# Patient Record
Sex: Male | Born: 1950 | Race: White | Hispanic: No | Marital: Single | Smoking: Current every day smoker
Health system: Southern US, Community
[De-identification: ages and names within clinical notes are randomized; demographics above are authoritative.]

## PROBLEM LIST (undated history)

## (undated) DIAGNOSIS — M109 Gout, unspecified: Secondary | ICD-10-CM

## (undated) DIAGNOSIS — I10 Essential (primary) hypertension: Secondary | ICD-10-CM

---

## 2005-09-22 ENCOUNTER — Encounter: Admission: RE | Admit: 2005-09-22 | Discharge: 2005-09-22 | Payer: Self-pay | Admitting: Orthopedic Surgery

## 2005-10-12 ENCOUNTER — Encounter: Admission: RE | Admit: 2005-10-12 | Discharge: 2005-10-12 | Payer: Self-pay | Admitting: Orthopedic Surgery

## 2010-11-26 ENCOUNTER — Encounter: Payer: Self-pay | Admitting: Orthopedic Surgery

## 2012-01-06 ENCOUNTER — Encounter (HOSPITAL_BASED_OUTPATIENT_CLINIC_OR_DEPARTMENT_OTHER): Payer: Self-pay | Admitting: *Deleted

## 2012-01-06 ENCOUNTER — Emergency Department (INDEPENDENT_AMBULATORY_CARE_PROVIDER_SITE_OTHER): Payer: BC Managed Care – PPO

## 2012-01-06 ENCOUNTER — Emergency Department (HOSPITAL_BASED_OUTPATIENT_CLINIC_OR_DEPARTMENT_OTHER)
Admission: EM | Admit: 2012-01-06 | Discharge: 2012-01-06 | Disposition: A | Discharge status: Home / Self Care | Payer: BC Managed Care – PPO | Attending: Emergency Medicine | Admitting: Emergency Medicine

## 2012-01-06 DIAGNOSIS — J069 Acute upper respiratory infection, unspecified: Secondary | ICD-10-CM | POA: Insufficient documentation

## 2012-01-06 DIAGNOSIS — F411 Generalized anxiety disorder: Secondary | ICD-10-CM | POA: Insufficient documentation

## 2012-01-06 DIAGNOSIS — R05 Cough: Secondary | ICD-10-CM

## 2012-01-06 DIAGNOSIS — I10 Essential (primary) hypertension: Secondary | ICD-10-CM | POA: Insufficient documentation

## 2012-01-06 DIAGNOSIS — F419 Anxiety disorder, unspecified: Secondary | ICD-10-CM

## 2012-01-06 DIAGNOSIS — F17200 Nicotine dependence, unspecified, uncomplicated: Secondary | ICD-10-CM | POA: Insufficient documentation

## 2012-01-06 DIAGNOSIS — Z79899 Other long term (current) drug therapy: Secondary | ICD-10-CM | POA: Insufficient documentation

## 2012-01-06 HISTORY — DX: Essential (primary) hypertension: I10

## 2012-01-06 MED ORDER — LORATADINE-PSEUDOEPHEDRINE ER 10-240 MG PO TB24: 1.0000 | ORAL_TABLET | Freq: Every day | ORAL | Status: AC

## 2012-01-06 MED ORDER — DEXAMETHASONE 4 MG PO TABS
8.0000 mg | ORAL_TABLET | Freq: Once | ORAL | Status: AC
  Administered 2012-01-06: 8 mg via ORAL
  Filled 2012-01-06: qty 2

## 2012-01-06 MED ORDER — LORAZEPAM 1 MG PO TABS: 1.0000 mg | ORAL_TABLET | Freq: Three times a day (TID) | ORAL | Status: AC | PRN

## 2012-01-06 NOTE — ED Notes (Signed)
Pt states he had a choking episode 8 days ago and has since had increased cough, congestion, SHOB, difficulty swallowing and feeling anxious

## 2012-01-06 NOTE — Discharge Instructions (Signed)

## 2012-01-06 NOTE — ED Provider Notes (Signed)
History   This chart was scribed for Frederick Bailiff, MD by Charolett Bumpers . The patient was seen in room MH03/MH03 and the patient's care was started at 7:20pm.   CSN: [id number]  Arrival date & time 01/06/12  1844   First MD Initiated Contact with Patient 01/06/12 1918      Chief Complaint  Patient presents with  . Cough    (Consider location/radiation/quality/duration/timing/severity/associated sxs/prior treatment) HPI Frederick Chen is a 61 y.o. male who presents to the Emergency Department complaining of intermittent, moderate cough that started about 8 days ago after a choking episode. Patient reports associated congestion, SOB, difficulty swallowing, and feeling anxious. Patient denies chest pain. Patient states that his cough has been productive with phlegm for the past 8 days. Patient notes that he has taken Mucinex and Claritin for his symptoms, but with no relief. Patient only notes hypertension in past medical hx.      Past Medical History  Diagnosis Date  . Hypertension     History reviewed. No pertinent past surgical history.  No family history on file.  History  Substance Use Topics  . Smoking status: Current Everyday Smoker  . Smokeless tobacco: Not on file  . Alcohol Use: No      Review of Systems  Constitutional: Negative for fever.  HENT: Positive for congestion, rhinorrhea and trouble swallowing. Negative for sore throat.   Respiratory: Positive for cough and shortness of breath.   Gastrointestinal: Negative for nausea, vomiting and diarrhea.  Psychiatric/Behavioral: The patient is nervous/anxious.   All other systems reviewed and are negative.    Allergies  Crestor  Home Medications   Current Outpatient Rx  Name Route Sig Dispense Refill  . ALLOPURINOL 100 MG PO TABS Oral Take 100 mg by mouth daily.    . ATORVASTATIN CALCIUM 40 MG PO TABS Oral Take 40 mg by mouth daily.    . CHOLINE FENOFIBRATE 135 MG PO CPDR Oral Take 135 mg by  mouth daily.    . OMEGA-3 FATTY ACIDS 1000 MG PO CAPS Oral Take 2 g by mouth 2 (two) times daily.    . GUAIFENESIN ER 600 MG PO TB12 Oral Take 600 mg by mouth 2 (two) times daily.    Marland Kitchen LORATADINE 10 MG PO TABS Oral Take 10 mg by mouth daily.    Marland Kitchen METOPROLOL TARTRATE 100 MG PO TABS Oral Take 100 mg by mouth 2 (two) times daily.    . QUINAPRIL HCL 40 MG PO TABS Oral Take 40 mg by mouth daily.    Marland Kitchen LORATADINE-PSEUDOEPHEDRINE ER 10-240 MG PO TB24 Oral Take 1 tablet by mouth daily. 5 tablet 0  . LORAZEPAM 1 MG PO TABS Oral Take 1 tablet (1 mg total) by mouth 3 (three) times daily as needed for anxiety. 15 tablet 0    BP 173/109  Pulse 62  Temp(Src) 98.2 F (36.8 C) (Oral)  Resp 20  Ht 6\' 1"  (1.854 m)  Wt 195 lb (88.451 kg)  BMI 25.73 kg/m2  SpO2 100%  Physical Exam  Nursing note and vitals reviewed. Constitutional: He is oriented to person, place, and time. He appears well-developed and well-nourished. No distress.  HENT:  Head: Normocephalic and atraumatic.  Mouth/Throat: Oropharynx is clear and moist. No oropharyngeal exudate.  Eyes: EOM are normal. Pupils are equal, round, and reactive to light.  Neck: Normal range of motion. Neck supple. No tracheal deviation present.  Cardiovascular: Normal rate, regular rhythm and normal heart sounds.  Exam reveals  no gallop and no friction rub.   No murmur heard. Pulmonary/Chest: Effort normal and breath sounds normal. No respiratory distress. He has no wheezes. He has no rales.  Abdominal: Soft. Bowel sounds are normal. He exhibits no distension. There is no tenderness.  Musculoskeletal: Normal range of motion. He exhibits no edema.  Neurological: He is alert and oriented to person, place, and time. No sensory deficit.  Skin: Skin is warm and dry.  Psychiatric: He has a normal mood and affect. His behavior is normal.    ED Course  Procedures (including critical care time)  DIAGNOSTIC STUDIES: Oxygen Saturation is 100% on room air, normal  by my interpretation.    COORDINATION OF CARE:  1925: Discussed with patient the planned course of treatment.  1930: Medication Orders: Dexamethasone tablet 8 mg-once.   Labs Reviewed - No data to display Dg Chest 2 View  01/06/2012  *RADIOLOGY REPORT*  Clinical Data: Cough  CHEST - 2 VIEW  Comparison: None  Findings: The heart size and mediastinal contours are within normal limits.  Both lungs are clear.  The visualized skeletal structures are unremarkable.  IMPRESSION: Negative exam.  Original Report Authenticated By: Rosealee Albee, M.D.     1. Anxiety   2. URI (upper respiratory infection)       MDM  Chest x-ray is negative. I feel the majority of his symptoms are anxiety related. He received a dose of Decadron for any associated pharyngeal swelling. We'll place on Ativan and a decongestant for his congestion. There is no evidence of sinusitis on examination. He will be discharged home with instructions to followup with primary care physician this week   I personally performed the services described in this documentation, which was scribed in my presence. The recorded information has been reviewed and considered.       Frederick Bailiff, MD 01/06/12 2026

## 2017-09-30 ENCOUNTER — Observation Stay (HOSPITAL_COMMUNITY): Payer: Medicare Other | Admitting: Registered Nurse

## 2017-09-30 ENCOUNTER — Inpatient Hospital Stay: Admit: 2017-09-30 | Payer: Medicare Other | Admitting: Surgery

## 2017-09-30 ENCOUNTER — Encounter (HOSPITAL_BASED_OUTPATIENT_CLINIC_OR_DEPARTMENT_OTHER): Payer: Self-pay | Admitting: Emergency Medicine

## 2017-09-30 ENCOUNTER — Encounter (HOSPITAL_COMMUNITY): Admission: EM | Disposition: A | Discharge status: Home / Self Care | Payer: Self-pay | Source: Home / Self Care

## 2017-09-30 ENCOUNTER — Other Ambulatory Visit: Payer: Self-pay

## 2017-09-30 ENCOUNTER — Emergency Department (HOSPITAL_BASED_OUTPATIENT_CLINIC_OR_DEPARTMENT_OTHER): Payer: Medicare Other

## 2017-09-30 ENCOUNTER — Inpatient Hospital Stay (HOSPITAL_BASED_OUTPATIENT_CLINIC_OR_DEPARTMENT_OTHER)
Admission: EM | Admit: 2017-09-30 | Discharge: 2017-10-03 | DRG: 340 | Disposition: A | Discharge status: Home / Self Care | Payer: Medicare Other | Attending: Psychiatry | Admitting: Psychiatry

## 2017-09-30 DIAGNOSIS — E669 Obesity, unspecified: Secondary | ICD-10-CM | POA: Diagnosis present

## 2017-09-30 DIAGNOSIS — R630 Anorexia: Secondary | ICD-10-CM | POA: Diagnosis present

## 2017-09-30 DIAGNOSIS — Z79899 Other long term (current) drug therapy: Secondary | ICD-10-CM

## 2017-09-30 DIAGNOSIS — K3580 Unspecified acute appendicitis: Secondary | ICD-10-CM

## 2017-09-30 DIAGNOSIS — K3532 Acute appendicitis with perforation and localized peritonitis, without abscess: Secondary | ICD-10-CM | POA: Diagnosis not present

## 2017-09-30 DIAGNOSIS — F17210 Nicotine dependence, cigarettes, uncomplicated: Secondary | ICD-10-CM | POA: Diagnosis present

## 2017-09-30 DIAGNOSIS — Z7982 Long term (current) use of aspirin: Secondary | ICD-10-CM

## 2017-09-30 DIAGNOSIS — Z888 Allergy status to other drugs, medicaments and biological substances status: Secondary | ICD-10-CM

## 2017-09-30 DIAGNOSIS — K219 Gastro-esophageal reflux disease without esophagitis: Secondary | ICD-10-CM | POA: Diagnosis present

## 2017-09-30 DIAGNOSIS — I10 Essential (primary) hypertension: Secondary | ICD-10-CM | POA: Diagnosis present

## 2017-09-30 DIAGNOSIS — M109 Gout, unspecified: Secondary | ICD-10-CM | POA: Diagnosis present

## 2017-09-30 DIAGNOSIS — E785 Hyperlipidemia, unspecified: Secondary | ICD-10-CM | POA: Diagnosis present

## 2017-09-30 HISTORY — DX: Gout, unspecified: M10.9

## 2017-09-30 HISTORY — PX: LAPAROSCOPIC APPENDECTOMY: SHX408

## 2017-09-30 LAB — COMPREHENSIVE METABOLIC PANEL
ALT: 18 U/L (ref 17–63)
AST: 22 U/L (ref 15–41)
Albumin: 4 g/dL (ref 3.5–5.0)
Alkaline Phosphatase: 89 U/L (ref 38–126)
Anion gap: 8 (ref 5–15)
BUN: 14 mg/dL (ref 6–20)
CO2: 26 mmol/L (ref 22–32)
Calcium: 9.2 mg/dL (ref 8.9–10.3)
Chloride: 103 mmol/L (ref 101–111)
Creatinine, Ser: 0.84 mg/dL (ref 0.61–1.24)
GFR calc Af Amer: >60 mL/min (ref >60)
GFR calc non Af Amer: >60 mL/min (ref >60)
Glucose, Bld: 110 mg/dL — ABNORMAL HIGH (ref 65–99)
Potassium: 4.2 mmol/L (ref 3.5–5.1)
Sodium: 137 mmol/L (ref 135–145)
Total Bilirubin: 0.9 mg/dL (ref 0.3–1.2)
Total Protein: 7.6 g/dL (ref 6.5–8.1)

## 2017-09-30 LAB — CBC
HCT: 45.5 % (ref 39.0–52.0)
Hemoglobin: 15.7 g/dL (ref 13.0–17.0)
MCH: 33.4 pg (ref 26.0–34.0)
MCHC: 34.5 g/dL (ref 30.0–36.0)
MCV: 96.8 fL (ref 78.0–100.0)
Platelets: 142 10*3/uL — ABNORMAL LOW (ref 150–400)
RBC: 4.7 MIL/uL (ref 4.22–5.81)
RDW: 14 % (ref 11.5–15.5)
WBC: 13.2 10*3/uL — ABNORMAL HIGH (ref 4.0–10.5)

## 2017-09-30 LAB — LIPASE, BLOOD: Lipase: 36 U/L (ref 11–51)

## 2017-09-30 SURGERY — APPENDECTOMY, LAPAROSCOPIC
Anesthesia: General | Site: Abdomen

## 2017-09-30 MED ORDER — RINGERS IRRIGATION IR SOLN
Status: DC | PRN
  Administered 2017-09-30: 1000 mL

## 2017-09-30 MED ORDER — PROMETHAZINE HCL 25 MG/ML IJ SOLN: 6.2500 mg | INTRAMUSCULAR | Status: DC | PRN

## 2017-09-30 MED ORDER — PIPERACILLIN-TAZOBACTAM 3.375 G IVPB
3.3750 g | Freq: Three times a day (TID) | INTRAVENOUS | Status: DC
  Filled 2017-09-30: qty 50

## 2017-09-30 MED ORDER — DEXAMETHASONE SODIUM PHOSPHATE 10 MG/ML IJ SOLN
INTRAMUSCULAR | Status: AC
  Filled 2017-09-30: qty 1

## 2017-09-30 MED ORDER — ONDANSETRON HCL 4 MG/2ML IJ SOLN
INTRAMUSCULAR | Status: AC
  Filled 2017-09-30: qty 2

## 2017-09-30 MED ORDER — MIDAZOLAM HCL 2 MG/2ML IJ SOLN
INTRAMUSCULAR | Status: AC
  Filled 2017-09-30: qty 2

## 2017-09-30 MED ORDER — PHENYLEPHRINE HCL 10 MG/ML IJ SOLN
INTRAMUSCULAR | Status: DC | PRN
  Administered 2017-09-30: 80 ug via INTRAVENOUS
  Administered 2017-09-30: 120 ug via INTRAVENOUS

## 2017-09-30 MED ORDER — IOPAMIDOL (ISOVUE-300) INJECTION 61%
100.0000 mL | Freq: Once | INTRAVENOUS | Status: AC | PRN
  Administered 2017-09-30: 100 mL via INTRAVENOUS

## 2017-09-30 MED ORDER — ROCURONIUM BROMIDE 50 MG/5ML IV SOSY
PREFILLED_SYRINGE | INTRAVENOUS | Status: AC
  Filled 2017-09-30: qty 5

## 2017-09-30 MED ORDER — ROCURONIUM BROMIDE 100 MG/10ML IV SOLN
INTRAVENOUS | Status: DC | PRN
  Administered 2017-09-30: 50 mg via INTRAVENOUS
  Administered 2017-09-30: 10 mg via INTRAVENOUS

## 2017-09-30 MED ORDER — 0.9 % SODIUM CHLORIDE (POUR BTL) OPTIME
TOPICAL | Status: DC | PRN
  Administered 2017-09-30: 1000 mL

## 2017-09-30 MED ORDER — PHENYLEPHRINE 40 MCG/ML (10ML) SYRINGE FOR IV PUSH (FOR BLOOD PRESSURE SUPPORT)
PREFILLED_SYRINGE | INTRAVENOUS | Status: AC
  Filled 2017-09-30: qty 10

## 2017-09-30 MED ORDER — BUPIVACAINE-EPINEPHRINE (PF) 0.25% -1:200000 IJ SOLN
INTRAMUSCULAR | Status: AC
  Filled 2017-09-30: qty 30

## 2017-09-30 MED ORDER — ONDANSETRON HCL 4 MG/2ML IJ SOLN
4.0000 mg | Freq: Once | INTRAMUSCULAR | Status: AC
  Administered 2017-09-30: 4 mg via INTRAVENOUS
  Filled 2017-09-30: qty 2

## 2017-09-30 MED ORDER — LACTATED RINGERS IV SOLN
INTRAVENOUS | Status: DC | PRN
  Administered 2017-09-30 (×2): via INTRAVENOUS

## 2017-09-30 MED ORDER — SODIUM CHLORIDE 0.9 % IV BOLUS (SEPSIS)
500.0000 mL | Freq: Once | INTRAVENOUS | Status: AC
  Administered 2017-09-30: 500 mL via INTRAVENOUS

## 2017-09-30 MED ORDER — ALBUMIN HUMAN 5 % IV SOLN
INTRAVENOUS | Status: AC
  Filled 2017-09-30: qty 250

## 2017-09-30 MED ORDER — FENTANYL CITRATE (PF) 250 MCG/5ML IJ SOLN
INTRAMUSCULAR | Status: AC
  Filled 2017-09-30: qty 5

## 2017-09-30 MED ORDER — SUCCINYLCHOLINE CHLORIDE 200 MG/10ML IV SOSY
PREFILLED_SYRINGE | INTRAVENOUS | Status: AC
  Filled 2017-09-30: qty 10

## 2017-09-30 MED ORDER — KCL IN DEXTROSE-NACL 20-5-0.45 MEQ/L-%-% IV SOLN: INTRAVENOUS | Status: DC

## 2017-09-30 MED ORDER — LIDOCAINE 2% (20 MG/ML) 5 ML SYRINGE
INTRAMUSCULAR | Status: AC
  Filled 2017-09-30: qty 5

## 2017-09-30 MED ORDER — ALBUMIN HUMAN 5 % IV SOLN
INTRAVENOUS | Status: DC | PRN
  Administered 2017-09-30: 23:00:00 via INTRAVENOUS

## 2017-09-30 MED ORDER — DEXAMETHASONE SODIUM PHOSPHATE 10 MG/ML IJ SOLN
INTRAMUSCULAR | Status: DC | PRN
  Administered 2017-09-30: 10 mg via INTRAVENOUS

## 2017-09-30 MED ORDER — MORPHINE SULFATE (PF) 4 MG/ML IV SOLN
4.0000 mg | INTRAVENOUS | Status: DC | PRN
  Administered 2017-09-30: 4 mg via INTRAVENOUS
  Filled 2017-09-30: qty 1

## 2017-09-30 MED ORDER — ONDANSETRON HCL 4 MG/2ML IJ SOLN
INTRAMUSCULAR | Status: DC | PRN
  Administered 2017-09-30: 4 mg via INTRAVENOUS

## 2017-09-30 MED ORDER — SUGAMMADEX SODIUM 200 MG/2ML IV SOLN
INTRAVENOUS | Status: AC
  Filled 2017-09-30: qty 2

## 2017-09-30 MED ORDER — FENTANYL CITRATE (PF) 100 MCG/2ML IJ SOLN
INTRAMUSCULAR | Status: DC | PRN
  Administered 2017-09-30: 100 ug via INTRAVENOUS
  Administered 2017-09-30: 50 ug via INTRAVENOUS
  Administered 2017-09-30: 100 ug via INTRAVENOUS
  Administered 2017-09-30 – 2017-10-01 (×2): 50 ug via INTRAVENOUS

## 2017-09-30 MED ORDER — PIPERACILLIN-TAZOBACTAM 3.375 G IVPB 30 MIN
3.3750 g | Freq: Once | INTRAVENOUS | Status: AC
  Administered 2017-09-30: 3.375 g via INTRAVENOUS
  Filled 2017-09-30 (×2): qty 50

## 2017-09-30 MED ORDER — PROPOFOL 10 MG/ML IV BOLUS
INTRAVENOUS | Status: AC
  Filled 2017-09-30: qty 40

## 2017-09-30 MED ORDER — SUCCINYLCHOLINE CHLORIDE 20 MG/ML IJ SOLN
INTRAMUSCULAR | Status: DC | PRN
  Administered 2017-09-30: 140 mg via INTRAVENOUS

## 2017-09-30 MED ORDER — LIDOCAINE HCL (CARDIAC) 20 MG/ML IV SOLN
INTRAVENOUS | Status: DC | PRN
  Administered 2017-09-30: 75 mg via INTRAVENOUS
  Administered 2017-09-30: 25 mg via INTRATRACHEAL

## 2017-09-30 MED ORDER — HYDROMORPHONE HCL 1 MG/ML IJ SOLN: 0.2500 mg | INTRAMUSCULAR | Status: DC | PRN

## 2017-09-30 MED ORDER — MORPHINE SULFATE (PF) 4 MG/ML IV SOLN: 4.0000 mg | Freq: Once | INTRAVENOUS | Status: DC

## 2017-09-30 MED ORDER — MIDAZOLAM HCL 5 MG/5ML IJ SOLN
INTRAMUSCULAR | Status: DC | PRN
  Administered 2017-09-30: 2 mg via INTRAVENOUS

## 2017-09-30 MED ORDER — PIPERACILLIN-TAZOBACTAM 3.375 G IVPB 30 MIN: 3.3750 g | Freq: Once | INTRAVENOUS | Status: DC

## 2017-09-30 MED ORDER — MORPHINE SULFATE (PF) 4 MG/ML IV SOLN
4.0000 mg | Freq: Once | INTRAVENOUS | Status: AC
  Administered 2017-09-30: 4 mg via INTRAVENOUS
  Filled 2017-09-30: qty 1

## 2017-09-30 MED ORDER — BUPIVACAINE-EPINEPHRINE 0.25% -1:200000 IJ SOLN
INTRAMUSCULAR | Status: DC | PRN
  Administered 2017-09-30: 10 mL

## 2017-09-30 MED ORDER — PROPOFOL 10 MG/ML IV BOLUS
INTRAVENOUS | Status: DC | PRN
  Administered 2017-09-30: 200 mg via INTRAVENOUS

## 2017-09-30 SURGICAL SUPPLY — 46 items
APPLIER CLIP ROT 10 11.4 M/L (STAPLE)
APR CLP MED LRG 11.4X10 (STAPLE)
BAG SPEC RTRVL LRG 6X4 10 (ENDOMECHANICALS) ×1
BLADE CLIPPER SENSICLIP SURGIC (BLADE) ×2 IMPLANT
CHLORAPREP W/TINT 26ML (MISCELLANEOUS) ×3 IMPLANT
CLIP APPLIE ROT 10 11.4 M/L (STAPLE) IMPLANT
CLOSURE WOUND 1/2 X4 (GAUZE/BANDAGES/DRESSINGS) ×1
COVER SURGICAL LIGHT HANDLE (MISCELLANEOUS) ×3 IMPLANT
CUTTER FLEX LINEAR 45M (STAPLE) ×2 IMPLANT
DECANTER SPIKE VIAL GLASS SM (MISCELLANEOUS) ×3 IMPLANT
DRAIN CHANNEL 19F RND (DRAIN) ×2 IMPLANT
DRAPE LAPAROSCOPIC ABDOMINAL (DRAPES) ×1 IMPLANT
ELECT REM PT RETURN 15FT ADLT (MISCELLANEOUS) ×3 IMPLANT
ENDOLOOP SUT PDS II  0 18 (SUTURE)
ENDOLOOP SUT PDS II 0 18 (SUTURE) IMPLANT
EVACUATOR DRAINAGE 10X20 100CC (DRAIN) IMPLANT
EVACUATOR SILICONE 100CC (DRAIN) ×3
GAUZE SPONGE 2X2 8PLY STRL LF (GAUZE/BANDAGES/DRESSINGS) IMPLANT
GAUZE SPONGE 4X4 12PLY STRL (GAUZE/BANDAGES/DRESSINGS) IMPLANT
GLOVE BIOGEL PI IND STRL 7.0 (GLOVE) IMPLANT
GLOVE BIOGEL PI INDICATOR 7.0 (GLOVE) ×2
GLOVE SURG ORTHO 8.0 STRL STRW (GLOVE) ×3 IMPLANT
GOWN STRL REUS W/TWL XL LVL3 (GOWN DISPOSABLE) ×6 IMPLANT
IV LACTATED RINGERS 1000ML (IV SOLUTION) ×2 IMPLANT
KIT BASIN OR (CUSTOM PROCEDURE TRAY) ×3 IMPLANT
NS IRRIG 1000ML POUR BTL (IV SOLUTION) ×2 IMPLANT
POUCH SPECIMEN RETRIEVAL 10MM (ENDOMECHANICALS) ×3 IMPLANT
RELOAD 45 VASCULAR/THIN (ENDOMECHANICALS) IMPLANT
RELOAD STAPLE 45 2.5 WHT GRN (ENDOMECHANICALS) IMPLANT
RELOAD STAPLE 45 3.5 BLU ETS (ENDOMECHANICALS) IMPLANT
RELOAD STAPLE TA45 3.5 REG BLU (ENDOMECHANICALS) ×3 IMPLANT
SET IRRIG TUBING LAPAROSCOPIC (IRRIGATION / IRRIGATOR) ×3 IMPLANT
SHEARS HARMONIC ACE PLUS 36CM (ENDOMECHANICALS) ×3 IMPLANT
SPONGE DRAIN TRACH 4X4 STRL 2S (GAUZE/BANDAGES/DRESSINGS) ×2 IMPLANT
SPONGE GAUZE 2X2 STER 10/PKG (GAUZE/BANDAGES/DRESSINGS) ×2
STRIP CLOSURE SKIN 1/2X4 (GAUZE/BANDAGES/DRESSINGS) ×2 IMPLANT
SUT ETHILON 2 0 PS N (SUTURE) ×2 IMPLANT
SUT MNCRL AB 4-0 PS2 18 (SUTURE) ×3 IMPLANT
TAPE CLOTH SURG 4X10 WHT LF (GAUZE/BANDAGES/DRESSINGS) ×2 IMPLANT
TOWEL OR 17X26 10 PK STRL BLUE (TOWEL DISPOSABLE) ×3 IMPLANT
TOWEL OR NON WOVEN STRL DISP B (DISPOSABLE) ×3 IMPLANT
TRAY FOLEY W/METER SILVER 14FR (SET/KITS/TRAYS/PACK) IMPLANT
TRAY FOLEY W/METER SILVER 16FR (SET/KITS/TRAYS/PACK) ×2 IMPLANT
TRAY LAPAROSCOPIC (CUSTOM PROCEDURE TRAY) ×3 IMPLANT
TROCAR XCEL BLUNT TIP 100MML (ENDOMECHANICALS) ×3 IMPLANT
TROCAR XCEL NON-BLD 11X100MML (ENDOMECHANICALS) ×3 IMPLANT

## 2017-09-30 NOTE — ED Triage Notes (Signed)
patient states that he is having pain to his right lower side and abdominal  - patient states that he is having increased pain with palpation to his right lower quad

## 2017-09-30 NOTE — ED Notes (Signed)
Pt arrives from [facility] for a surgery consult.

## 2017-09-30 NOTE — Anesthesia Procedure Notes (Signed)
Procedure Name: Intubation Date/Time: 09/30/2017 10:42 PM Performed by: Lissa Morales, CRNA Pre-anesthesia Checklist: Patient identified, Emergency Drugs available, Suction available and Patient being monitored Patient Re-evaluated:Patient Re-evaluated prior to induction Oxygen Delivery Method: Circle system utilized Preoxygenation: Pre-oxygenation with 100% oxygen Induction Type: IV induction Ventilation: Mask ventilation without difficulty Laryngoscope Size: Mac and 4 Grade View: Grade II Tube type: Oral Tube size: 8.0 mm Number of attempts: 1 Airway Equipment and Method: Stylet and Oral airway Placement Confirmation: ETT inserted through vocal cords under direct vision,  positive ETCO2 and breath sounds checked- equal and bilateral Secured at: 22 cm Tube secured with: Tape Dental Injury: Teeth and Oropharynx as per pre-operative assessment

## 2017-09-30 NOTE — ED Notes (Signed)
Pt attempted to provide a urine, but was unable to at this time.

## 2017-09-30 NOTE — OR Nursing (Signed)
Patient valuables itemized with patient and sent to security safe. Items consist of: X6, 20 dollar bills, x1 5 dollar bills, x1 1 dollar bill, x1 [place] [address] Visa, x1 Cherylsidexxon Mobil Card, x1 Bancorp [place] Continental AirlinesMC, x1 Lowes Card, x1 [place] Radiation protection practitionerDriver License, x1 Intel Corporationmerican Express, Goldman Sachsx1 Voter Registration Card, x1 Neiman [place] Far[place] card, x1 Dillards Card, x1 W.W. Grainger IncMacy American Express card, Consecox1 American Express Business Gold card, Hexion Specialty Chemicalsx1 Black iPhone, x1 Genuine PartsLincoln Key fob, x3 discount MetLifemerchant cards (Lowes Foods, My KathrynPanera, Goodrich CorporationFood Lion, x4 Southern Companynsurance cards ([place]Aetna, Horse ShoeAetnaRX, Harrah's EntertainmentMedicare card x2), x1 Enterprise ProductsUnited Mileage Card, x1 Blue cross United Technologies CorporationBlue shield card, x2 Social Security cards, Golden West Financialx1 Library Card.  This RN has possession of safe key, will transfer possession of the key to floor RN upon admission of patient.

## 2017-09-30 NOTE — H&P (Signed)
Frederick Chen is an 66 y.o. male.    General Surgery [facility]  Chief Complaint: abd pain, acute appendicitis  HPI: patient is a 66 yo WM referred by Dr. Nanda Quinton at [place] for surgical management of acute appendicitis.  Patient presented with 2 day hx of abd pain localizing to the RLQ.  Denies nausea or emesis, but anorexia present.  Denies fever or chills.  No prior abdominal surgery.  WBC 13K.  CTA positive for acute appendicitis.  Patient transferred to [facility] ER for evaluation and preparation for surgery.  Past Medical History:  Diagnosis Date  . Gout   . Hypertension     History reviewed. No pertinent surgical history.  History reviewed. No pertinent family history. Social History:  reports that he has been smoking.  he has never used smokeless tobacco. He reports that he does not drink alcohol or use drugs.  Allergies:  Allergies  Allergen Reactions  . Crestor [Rosuvastatin Calcium]     Muscle weakness      (Not in a hospital admission)  Results for orders placed or performed during the hospital encounter of 09/30/17 (from the past 48 hour(s))  Lipase, blood     Status: None   Collection Time: 09/30/17  3:15 PM  Result Value Ref Range   Lipase 36 11 - 51 U/L  Comprehensive metabolic panel     Status: Abnormal   Collection Time: 09/30/17  3:15 PM  Result Value Ref Range   Sodium 137 135 - 145 mmol/L   Potassium 4.2 3.5 - 5.1 mmol/L   Chloride 103 101 - 111 mmol/L   CO2 26 22 - 32 mmol/L   Glucose, Bld 110 (H) 65 - 99 mg/dL   BUN 14 6 - 20 mg/dL   Creatinine, Ser 0.84 0.61 - 1.24 mg/dL   Calcium 9.2 8.9 - 10.3 mg/dL   Total Protein 7.6 6.5 - 8.1 g/dL   Albumin 4.0 3.5 - 5.0 g/dL   AST 22 15 - 41 U/L   ALT 18 17 - 63 U/L   Alkaline Phosphatase 89 38 - 126 U/L   Total Bilirubin 0.9 0.3 - 1.2 mg/dL   GFR calc non Af Amer >60 >60 mL/min   GFR calc Af Amer >60 >60 mL/min    Comment: (NOTE) The eGFR has been calculated using the CKD  EPI equation. This calculation has not been validated in all clinical situations. eGFR's persistently <60 mL/min signify possible Chronic Kidney Disease.    Anion gap 8 5 - 15  CBC     Status: Abnormal   Collection Time: 09/30/17  3:15 PM  Result Value Ref Range   WBC 13.2 (H) 4.0 - 10.5 K/uL   RBC 4.70 4.22 - 5.81 MIL/uL   Hemoglobin 15.7 13.0 - 17.0 g/dL   HCT 45.5 39.0 - 52.0 %   MCV 96.8 78.0 - 100.0 fL   MCH 33.4 26.0 - 34.0 pg   MCHC 34.5 30.0 - 36.0 g/dL   RDW 14.0 11.5 - 15.5 %   Platelets 142 (L) 150 - 400 K/uL   Ct Abdomen Pelvis W Contrast  Result Date: 09/30/2017 CLINICAL DATA:  Right lower quadrant pain times several days with tenderness to palpation, decreased appetite, fever and leukocytosis. EXAM: CT ABDOMEN AND PELVIS WITH CONTRAST TECHNIQUE: Multidetector CT imaging of the abdomen and pelvis was performed using the standard protocol following bolus administration of intravenous contrast. CONTRAST:  173m ISOVUE-300 IOPAMIDOL (ISOVUE-300) INJECTION 61% COMPARISON:  05/04/2016 FINDINGS: Lower chest: Heart size is normal with minimal coronary arteriosclerosis. Mild distal esophageal thickening appears chronic and may reflect chronic esophagitis. There is dependent atelectasis at the lung bases. Tiny subpleural nodular opacities are seen within the lung bases more so the right, nonspecific possibly postinfectious or post inflammatory in etiology, the largest are approximately 4 mm. Hepatobiliary: Homogeneous enhancement of the liver. There appear to be tiny dependent densities within the gallbladder that are suspicious for stones and/or biliary sludge. No biliary dilatation. Pancreas: No ductal dilatation or inflammation of the pancreas. No focal mass. Spleen: Normal size spleen without mass. Adrenals/Urinary Tract: Mild cortical thinning of both kidneys along the midportion. No obstructive uropathy or enhancing masses. No adrenal mass. Physiologic distention of the urinary  bladder. Mild sympathetic thickening along the anterior hand right lateral wall likely secondary to adjacent inflammatory disease from appendicitis. Stomach/Bowel: Extensive right lower quadrant mesenteric fatty inflammatory change with trace right lower quadrant and hemi pelvic free fluid is identified without enhancement. This is secondary to acute appendicitis. Appendix: Location: Right lower quadrant at McBurney's point Diameter: 13 mm Appendicolith: Present along its proximal to midportion measuring up to 17 mm in length by 9 mm in thickness. Mucosal hyper-enhancement: Present Extraluminal gas: None Periappendiceal collection: None There is sigmoid diverticulosis without acute diverticulitis. Vascular/Lymphatic: Moderate aortoiliac atherosclerosis. No lymphadenopathy by CT size criteria. Reproductive: Normal size prostate with central zone calcifications. Other: Omental fat and fluid containing right inguinal canal. Musculoskeletal: Lumbar spondylosis with marked degenerative disc disease and vacuum disc phenomenon. No acute nor suspicious osseous abnormality. IMPRESSION: 1. Moderate-to-marked periappendiceal mesenteric inflammation secondary to acute appendicitis. The appendix measures up to 13 mm and contains hyperdense material within consistent and appendicular as above described. No perforation or definite enhancing fluid to suggest abscess. 2. Mild renal cortical thinning.  No obstructive uropathy. 3. Chronic mild distal esophageal thickening question esophagitis. 4. Lumbar spondylosis without acute osseous abnormality. 5. Faint hyperdensities along the dependent aspect of the gallbladder are suggested. Findings are suspicious for small gallstones and biliary sludge. 6. Omental fat and fluid containing right inguinal hernia. Electronically Signed   By: Ashley Royalty M.D.   On: 09/30/2017 18:27    Review of Systems  Constitutional: Negative for chills, diaphoresis, fever and weight loss.  HENT:  Negative.   Eyes: Negative.   Respiratory: Negative.   Cardiovascular: Negative.   Gastrointestinal: Positive for abdominal pain (RLQ) and heartburn. Negative for constipation, diarrhea, nausea and vomiting.  Genitourinary: Negative.   Musculoskeletal: Negative.   Skin: Negative.   Neurological: Negative.  Negative for weakness.  Endo/Heme/Allergies: Negative.   Psychiatric/Behavioral: Negative.     Blood pressure 140/72, pulse 80, temperature 99.2 F (37.3 C), temperature source Oral, resp. rate 18, height _0  (1.854 m), SpO2 94 %. Physical Exam  Constitutional: He is oriented to person, place, and time. He appears well-developed and well-nourished. No distress.  HENT:  Head: Normocephalic and atraumatic.  Right Ear: External ear normal.  Left Ear: External ear normal.  Mouth/Throat: No oropharyngeal exudate.  Eyes: Conjunctivae are normal. Pupils are equal, round, and reactive to light. No scleral icterus.  Neck: Normal range of motion. Neck supple. No tracheal deviation present. No thyromegaly present.  Cardiovascular: Normal rate, regular rhythm, normal heart sounds and intact distal pulses.  No murmur heard. Respiratory: Effort normal and breath sounds normal. No respiratory distress. He has no wheezes.  GI: Soft. He exhibits no distension and no mass. There is tenderness (RLQ). There is rebound and  guarding.  Musculoskeletal: Normal range of motion. He exhibits no edema, tenderness or deformity.  Neurological: He is alert and oriented to person, place, and time.  Skin: Skin is warm and dry. He is not diaphoretic.  Psychiatric: He has a normal mood and affect. His behavior is normal.     Assessment/Plan Acute appendicitis  IV Zosyn given by ER MD  NPO  IVF   Plan lap appendectomy this evening.  Discussed with patient.  Discussed hospital stay.  He understands and wishes to proceed.  The risks and benefits of the procedure have been discussed at length with the  patient.  The patient understands the proposed procedure, potential alternative treatments, and the course of recovery to be expected.  All of the patient's questions have been answered at this time.  The patient wishes to proceed with surgery.  Armandina Gemma, [facility] Office: [phone]    Earnstine Regal, MD 09/30/2017, 9:24 PM

## 2017-09-30 NOTE — ED Notes (Signed)
Pt to OR.

## 2017-09-30 NOTE — Anesthesia Preprocedure Evaluation (Signed)
Anesthesia Evaluation  Patient identified by MRN, date of birth, ID band Patient awake    Reviewed: Allergy & Precautions, NPO status , Patient's Chart, lab work & pertinent test results, reviewed documented beta blocker date and time   History of Anesthesia Complications Negative for: history of anesthetic complications  Airway Mallampati: II  TM Distance: >3 FB Neck ROM: Full    Dental no notable dental hx. (+) Dental Advisory Given   Pulmonary Current Smoker,    Pulmonary exam normal        Cardiovascular hypertension, Pt. on medications and Pt. on home beta blockers Normal cardiovascular exam     Neuro/Psych negative neurological ROS  negative psych ROS   GI/Hepatic Neg liver ROS,   Endo/Other  negative endocrine ROS  Renal/GU negative Renal ROS  negative genitourinary   Musculoskeletal negative musculoskeletal ROS (+)   Abdominal   Peds negative pediatric ROS (+)  Hematology negative hematology ROS (+)   Anesthesia Other Findings   Reproductive/Obstetrics negative OB ROS                             Anesthesia Physical Anesthesia Plan  ASA: II and emergent  Anesthesia Plan: General   Post-op Pain Management:    Induction: Intravenous, Rapid sequence and Cricoid pressure planned  PONV Risk Score and Plan: 2 and Ondansetron and Dexamethasone  Airway Management Planned: Oral ETT  Additional Equipment:   Intra-op Plan:   Post-operative Plan: Extubation in OR  Informed Consent: I have reviewed the patients History and Physical, chart, labs and discussed the procedure including the risks, benefits and alternatives for the proposed anesthesia with the patient or authorized representative who has indicated his/her understanding and acceptance.   Dental advisory given  Plan Discussed with: CRNA, Anesthesiologist and Surgeon  Anesthesia Plan Comments:          Anesthesia Quick Evaluation

## 2017-09-30 NOTE — ED Notes (Signed)
[facility] arrived to transport pt 

## 2017-09-30 NOTE — ED Provider Notes (Signed)
MSE was initiated and I personally evaluated the patient and placed orders (if any) at  8:42 PM on September 30, 2017. Patient here for [facility]. [place] to have appendectomy.  Will medicate for pain.  Discussed with OR nurse who will let Dr. Gerrit Friendsgerkin know that the patient is here The patient appears stable so that the remainder of the MSE may be completed by another provider.   Lorre NickAllen, Starlene Consuegra, MD 09/30/17 2043

## 2017-09-30 NOTE — ED Provider Notes (Signed)
Emergency Department Provider Note   I have reviewed the triage vital signs and the nursing notes.   HISTORY  Chief Complaint Abdominal Pain   HPI Frederick Chen is a 66 y.o. male with HTN presents to the emergency department for evaluation of diffuse abdominal discomfort that has now localized to the right lower quadrant.  Patient has had 2 days of pain with right sided pain starting today.  No radiation of symptoms.  He had a small bowel movement today but no diarrhea.  No blood in the bowel movement.  No nausea or vomiting.  He has not felt like eating anything for the past 2 days.  He did eat a small egg today at lunch, however.  No fevers or chills.  No chest pain or difficulty breathing.   Past Medical History:  Diagnosis Date  . Gout   . Hypertension     There are no active problems to display for this patient.   History reviewed. No pertinent surgical history.  Current Outpatient Rx  . Order #: [id number] Class: Historical Med  . Order #: [id number] Class: Historical Med  . Order #: [id number] Class: Historical Med  . Order #: [id number] Class: Historical Med  . Order #: [id number] Class: Historical Med  . Order #: [id number] Class: Historical Med  . Order #: [id number] Class: Historical Med  . Order #: [id number] Class: Historical Med    Allergies Crestor [rosuvastatin calcium]  History reviewed. No pertinent family history.  Social History Social History   Tobacco Use  . Smoking status: Current Every Day Smoker  . Smokeless tobacco: Never Used  Substance Use Topics  . Alcohol use: No  . Drug use: No    Review of Systems  Constitutional: No fever/chills Eyes: No visual changes. ENT: No sore throat. Cardiovascular: Denies chest pain. Respiratory: Denies shortness of breath. Gastrointestinal: Positive RLQ abdominal pain.  No nausea, no vomiting.  No diarrhea.  No constipation. Genitourinary: Negative for dysuria. Musculoskeletal: Negative for back pain. Skin: Negative for  rash. Neurological: Negative for headaches, focal weakness or numbness.  10-point ROS otherwise negative.  ____________________________________________   PHYSICAL EXAM:  VITAL SIGNS: ED Triage Vitals  Enc Vitals Group     BP 09/30/17 1443 (!) 148/91     Pulse Rate 09/30/17 1443 83     Resp 09/30/17 1443 18     Temp 09/30/17 1443 98.1 F (36.7 C)     Temp Source 09/30/17 1443 Oral     SpO2 09/30/17 1443 100 %     Weight --      Height 09/30/17 1441 6\' 1"  (1.854 m)     Pain Score 09/30/17 1441 5    Constitutional: Alert and oriented. Well appearing and in no acute distress. Eyes: Conjunctivae are normal.  Head: Atraumatic. Nose: No congestion/rhinnorhea. Mouth/Throat: Mucous membranes are moist.  Neck: No stridor.  Cardiovascular: Normal rate, regular rhythm. Good peripheral circulation. Grossly normal heart sounds.   Respiratory: Normal respiratory effort.  No retractions. Lungs CTAB. Gastrointestinal: Soft with focal RLQ tenderness with rebound and voluntary guarding. Positive Rosving's sign. No distention.  Musculoskeletal: No lower extremity tenderness nor edema. No gross deformities of extremities. Neurologic:  Normal speech and language. No gross focal neurologic deficits are appreciated.  Skin:  Skin is warm, dry and intact. No rash noted.  ____________________________________________   LABS (all labs ordered are listed, but only abnormal results are displayed)  Labs Reviewed  COMPREHENSIVE METABOLIC PANEL - Abnormal; Notable for the following components:      Result  Value   Glucose, Bld 110 (*)    All other components within normal limits  CBC - Abnormal; Notable for the following components:   WBC 13.2 (*)    Platelets 142 (*)    All other components within normal limits  LIPASE, BLOOD  URINALYSIS, ROUTINE W REFLEX MICROSCOPIC   ____________________________________________  RADIOLOGY  Ct Abdomen Pelvis W Contrast  Result Date: 09/30/2017 CLINICAL  DATA:  Right lower quadrant pain times several days with tenderness to palpation, decreased appetite, fever and leukocytosis. EXAM: CT ABDOMEN AND PELVIS WITH CONTRAST TECHNIQUE: Multidetector CT imaging of the abdomen and pelvis was performed using the standard protocol following bolus administration of intravenous contrast. CONTRAST:  100mL ISOVUE-300 IOPAMIDOL (ISOVUE-300) INJECTION 61% COMPARISON:  05/04/2016 FINDINGS: Lower chest: Heart size is normal with minimal coronary arteriosclerosis. Mild distal esophageal thickening appears chronic and may reflect chronic esophagitis. There is dependent atelectasis at the lung bases. Tiny subpleural nodular opacities are seen within the lung bases more so the right, nonspecific possibly postinfectious or post inflammatory in etiology, the largest are approximately 4 mm. Hepatobiliary: Homogeneous enhancement of the liver. There appear to be tiny dependent densities within the gallbladder that are suspicious for stones and/or biliary sludge. No biliary dilatation. Pancreas: No ductal dilatation or inflammation of the pancreas. No focal mass. Spleen: Normal size spleen without mass. Adrenals/Urinary Tract: Mild cortical thinning of both kidneys along the midportion. No obstructive uropathy or enhancing masses. No adrenal mass. Physiologic distention of the urinary bladder. Mild sympathetic thickening along the anterior hand right lateral wall likely secondary to adjacent inflammatory disease from appendicitis. Stomach/Bowel: Extensive right lower quadrant mesenteric fatty inflammatory change with trace right lower quadrant and hemi pelvic free fluid is identified without enhancement. This is secondary to acute appendicitis. Appendix: Location: Right lower quadrant at McBurney's point Diameter: 13 mm Appendicolith: Present along its proximal to midportion measuring up to 17 mm in length by 9 mm in thickness. Mucosal hyper-enhancement: Present Extraluminal gas: None  Periappendiceal collection: None There is sigmoid diverticulosis without acute diverticulitis. Vascular/Lymphatic: Moderate aortoiliac atherosclerosis. No lymphadenopathy by CT size criteria. Reproductive: Normal size prostate with central zone calcifications. Other: Omental fat and fluid containing right inguinal canal. Musculoskeletal: Lumbar spondylosis with marked degenerative disc disease and vacuum disc phenomenon. No acute nor suspicious osseous abnormality. IMPRESSION: 1. Moderate-to-marked periappendiceal mesenteric inflammation secondary to acute appendicitis. The appendix measures up to 13 mm and contains hyperdense material within consistent and appendicular as above described. No perforation or definite enhancing fluid to suggest abscess. 2. Mild renal cortical thinning.  No obstructive uropathy. 3. Chronic mild distal esophageal thickening question esophagitis. 4. Lumbar spondylosis without acute osseous abnormality. 5. Faint hyperdensities along the dependent aspect of the gallbladder are suggested. Findings are suspicious for small gallstones and biliary sludge. 6. Omental fat and fluid containing right inguinal hernia. Electronically Signed   By: Tollie Ethavid  Kwon M.D.   On: 09/30/2017 18:27    ____________________________________________   PROCEDURES  Procedure(s) performed:   Procedures  None ____________________________________________   INITIAL IMPRESSION / ASSESSMENT AND PLAN / ED COURSE  Pertinent labs & imaging results that were available during my care of the patient were reviewed by me and considered in my medical decision making (see chart for details).  Patient presents emergency department for evaluation of 2 days of diffuse abdominal pain now localizing in the right lower quadrant.  He has voluntary guarding over the right lower quadrant with a positive Rovsing sign. Very high suspicion for acute appendicitis.  WBC count elevated to 13. CT ordered.   06:48 PM Spoke with  Dr. Gerrit FriendsGerkin with gen surgery. [facility] with plan to go to the OR from there. Spoke with EDP Dr. Freida BusmanAllen to make him aware of patient ED-ED transfer. Will reach out to [facility] for transport. Updated patient regarding diagnosis and transfer for surgery evaluation/treatment.   Discussed patient's case with General Surgery, Dr. Gerrit FriendsGerkin to request admission. Patient and family (if present) updated with plan. Care transferred to General Surgery service.  I reviewed all nursing notes, vitals, pertinent old records, EKGs, labs, imaging (as available).  ____________________________________________  FINAL CLINICAL IMPRESSION(S) / ED DIAGNOSES  Final diagnoses:  Acute appendicitis, unspecified acute appendicitis type     MEDICATIONS GIVEN DURING THIS VISIT:  Medications  piperacillin-tazobactam (ZOSYN) IVPB 3.375 g (3.375 g Intravenous New Bag/Given 09/30/17 1853)  morphine 4 MG/ML injection 4 mg (4 mg Intravenous Given 09/30/17 1851)  sodium chloride 0.9 % bolus 500 mL (500 mLs Intravenous New Bag/Given 09/30/17 1602)  morphine 4 MG/ML injection 4 mg (4 mg Intravenous Given 09/30/17 1607)  ondansetron (ZOFRAN) injection 4 mg (4 mg Intravenous Given 09/30/17 1607)  iopamidol (ISOVUE-300) 61 % injection 100 mL (100 mLs Intravenous Contrast Given 09/30/17 1726)    Note:  This document was prepared using Dragon voice recognition software and may include unintentional dictation errors.  Alona BeneJoshua Long, MD Emergency Medicine    Long, Arlyss RepressJoshua G, MD 09/30/17 [phone]

## 2017-10-01 ENCOUNTER — Encounter (HOSPITAL_COMMUNITY): Payer: Self-pay | Admitting: Surgery

## 2017-10-01 DIAGNOSIS — F17210 Nicotine dependence, cigarettes, uncomplicated: Secondary | ICD-10-CM | POA: Diagnosis present

## 2017-10-01 DIAGNOSIS — K219 Gastro-esophageal reflux disease without esophagitis: Secondary | ICD-10-CM | POA: Diagnosis present

## 2017-10-01 DIAGNOSIS — M109 Gout, unspecified: Secondary | ICD-10-CM | POA: Diagnosis present

## 2017-10-01 DIAGNOSIS — I10 Essential (primary) hypertension: Secondary | ICD-10-CM | POA: Diagnosis present

## 2017-10-01 DIAGNOSIS — K3580 Unspecified acute appendicitis: Secondary | ICD-10-CM | POA: Diagnosis present

## 2017-10-01 DIAGNOSIS — R630 Anorexia: Secondary | ICD-10-CM | POA: Diagnosis present

## 2017-10-01 DIAGNOSIS — Z7982 Long term (current) use of aspirin: Secondary | ICD-10-CM | POA: Diagnosis not present

## 2017-10-01 DIAGNOSIS — Z888 Allergy status to other drugs, medicaments and biological substances status: Secondary | ICD-10-CM | POA: Diagnosis not present

## 2017-10-01 DIAGNOSIS — E669 Obesity, unspecified: Secondary | ICD-10-CM | POA: Diagnosis present

## 2017-10-01 DIAGNOSIS — K3532 Acute appendicitis with perforation and localized peritonitis, without abscess: Secondary | ICD-10-CM | POA: Diagnosis present

## 2017-10-01 DIAGNOSIS — Z79899 Other long term (current) drug therapy: Secondary | ICD-10-CM | POA: Diagnosis not present

## 2017-10-01 DIAGNOSIS — E785 Hyperlipidemia, unspecified: Secondary | ICD-10-CM | POA: Diagnosis present

## 2017-10-01 MED ORDER — ONDANSETRON HCL 4 MG/2ML IJ SOLN: 4.0000 mg | Freq: Four times a day (QID) | INTRAMUSCULAR | Status: DC | PRN

## 2017-10-01 MED ORDER — PIPERACILLIN-TAZOBACTAM 3.375 G IVPB: 3.3750 g | Freq: Three times a day (TID) | INTRAVENOUS | Status: DC

## 2017-10-01 MED ORDER — ONDANSETRON 4 MG PO TBDP
4.0000 mg | ORAL_TABLET | Freq: Four times a day (QID) | ORAL | Status: DC | PRN
Start: 2017-10-01 — End: 2017-10-03

## 2017-10-01 MED ORDER — PANTOPRAZOLE SODIUM 40 MG IV SOLR
40.0000 mg | Freq: Every day | INTRAVENOUS | Status: DC
  Administered 2017-10-01: 40 mg via INTRAVENOUS
  Filled 2017-10-01: qty 40

## 2017-10-01 MED ORDER — ACETAMINOPHEN 650 MG RE SUPP: 650.0000 mg | Freq: Four times a day (QID) | RECTAL | Status: DC | PRN

## 2017-10-01 MED ORDER — TRAMADOL HCL 50 MG PO TABS: 50.0000 mg | ORAL_TABLET | Freq: Four times a day (QID) | ORAL | Status: DC | PRN

## 2017-10-01 MED ORDER — FAMOTIDINE 20 MG PO TABS
20.0000 mg | ORAL_TABLET | Freq: Two times a day (BID) | ORAL | Status: DC
  Administered 2017-10-01 – 2017-10-03 (×5): 20 mg via ORAL
  Filled 2017-10-01 (×5): qty 1

## 2017-10-01 MED ORDER — ENOXAPARIN SODIUM 40 MG/0.4ML ~~LOC~~ SOLN
40.0000 mg | SUBCUTANEOUS | Status: DC
Start: 2017-10-01 — End: 2017-10-03
  Administered 2017-10-01 – 2017-10-02 (×2): 40 mg via SUBCUTANEOUS
  Filled 2017-10-01 (×2): qty 0.4

## 2017-10-01 MED ORDER — ACETAMINOPHEN 10 MG/ML IV SOLN
INTRAVENOUS | Status: AC
  Filled 2017-10-01: qty 100

## 2017-10-01 MED ORDER — SUGAMMADEX SODIUM 200 MG/2ML IV SOLN
INTRAVENOUS | Status: DC | PRN
  Administered 2017-09-30: 200 mg via INTRAVENOUS

## 2017-10-01 MED ORDER — ALLOPURINOL 100 MG PO TABS
100.0000 mg | ORAL_TABLET | Freq: Every day | ORAL | Status: DC
Start: 2017-10-01 — End: 2017-10-03
  Administered 2017-10-01 – 2017-10-03 (×3): 100 mg via ORAL
  Filled 2017-10-01 (×3): qty 1

## 2017-10-01 MED ORDER — LISINOPRIL 20 MG PO TABS
40.0000 mg | ORAL_TABLET | Freq: Every day | ORAL | Status: DC
  Administered 2017-10-01 – 2017-10-03 (×3): 40 mg via ORAL
  Filled 2017-10-01 (×3): qty 2

## 2017-10-01 MED ORDER — PIPERACILLIN-TAZOBACTAM 3.375 G IVPB
3.3750 g | Freq: Three times a day (TID) | INTRAVENOUS | Status: DC
  Administered 2017-10-01 – 2017-10-03 (×8): 3.375 g via INTRAVENOUS
  Filled 2017-10-01 (×4): qty 50

## 2017-10-01 MED ORDER — KCL IN DEXTROSE-NACL 20-5-0.45 MEQ/L-%-% IV SOLN
INTRAVENOUS | Status: DC
  Administered 2017-10-01 – 2017-10-02 (×2): via INTRAVENOUS
  Filled 2017-10-01 (×5): qty 1000

## 2017-10-01 MED ORDER — ACETAMINOPHEN 10 MG/ML IV SOLN
INTRAVENOUS | Status: DC | PRN
  Administered 2017-10-01: 1000 mg via INTRAVENOUS

## 2017-10-01 MED ORDER — METOPROLOL TARTRATE 50 MG PO TABS
100.0000 mg | ORAL_TABLET | Freq: Two times a day (BID) | ORAL | Status: DC
  Administered 2017-10-01 – 2017-10-03 (×5): 100 mg via ORAL
  Filled 2017-10-01: qty 2
  Filled 2017-10-01: qty 4
  Filled 2017-10-01: qty 2
  Filled 2017-10-01: qty 4
  Filled 2017-10-01 (×3): qty 2

## 2017-10-01 MED ORDER — ACETAMINOPHEN 325 MG PO TABS
650.0000 mg | ORAL_TABLET | Freq: Four times a day (QID) | ORAL | Status: DC | PRN
Start: 2017-10-01 — End: 2017-10-03

## 2017-10-01 MED ORDER — HYDROMORPHONE HCL 1 MG/ML IJ SOLN: 1.0000 mg | INTRAMUSCULAR | Status: DC | PRN

## 2017-10-01 MED ORDER — AMLODIPINE BESYLATE 5 MG PO TABS
2.5000 mg | ORAL_TABLET | Freq: Every day | ORAL | Status: DC
  Administered 2017-10-01 – 2017-10-02 (×2): 2.5 mg via ORAL
  Filled 2017-10-01 (×2): qty 1

## 2017-10-01 MED ORDER — HYDROCODONE-ACETAMINOPHEN 5-325 MG PO TABS
1.0000 | ORAL_TABLET | ORAL | Status: DC | PRN
  Administered 2017-10-01 (×3): 1 via ORAL
  Administered 2017-10-02 (×2): 2 via ORAL
  Filled 2017-10-01: qty 1
  Filled 2017-10-01: qty 2
  Filled 2017-10-01 (×2): qty 1
  Filled 2017-10-01: qty 2

## 2017-10-01 NOTE — Discharge Instructions (Addendum)
Please arrive at least 30 min before your appointment to complete your check in paperwork.  If you are unable to arrive 30 min prior to your appointment time we may have to cancel or reschedule you. ° °LAPAROSCOPIC SURGERY: POST OP INSTRUCTIONS  °1. DIET: Follow a light bland diet the first 24 hours after arrival home, such as soup, liquids, crackers, etc. Be sure to include lots of fluids daily. Avoid fast food or heavy meals as your are more likely to get nauseated. Eat a low fat the next few days after surgery.  °2. Take your usually prescribed home medications unless otherwise directed. °3. PAIN CONTROL:  °1. Pain is best controlled by a usual combination of three different methods TOGETHER:  °1. Ice/Heat °2. Over the counter pain medication °3. Prescription pain medication °2. Most patients will experience some swelling and bruising around the incisions. Ice packs or heating pads (30-60 minutes up to 6 times a day) will help. Use ice for the first few days to help decrease swelling and bruising, then switch to heat to help relax tight/sore spots and speed recovery. Some people prefer to use ice alone, heat alone, alternating between ice & heat. Experiment to what works for you. Swelling and bruising can take several weeks to resolve.  °3. It is helpful to take an over-the-counter pain medication regularly for the first few weeks. Choose one of the following that works best for you:  °1. Naproxen (Aleve, etc) Two 220mg tabs twice a day °2. Ibuprofen (Advil, etc) Three 200mg tabs four times a day (every meal & bedtime) °3. Acetaminophen (Tylenol, etc) 500-650mg four times a day (every meal & bedtime) °4. A prescription for pain medication (such as oxycodone, hydrocodone, etc) should be given to you upon discharge. Take your pain medication as prescribed.  °1. If you are having problems/concerns with the prescription medicine (does not control pain, nausea, vomiting, rash, itching, etc), please call us [phone]  [phone] to see if we need to switch you to a different pain medicine that will work better for you and/or control your side effect better. °2. If you need a refill on your pain medication, please contact your pharmacy. They will contact our office to request authorization. Prescriptions will not be filled after 5 pm or on week-ends. °4. Avoid getting constipated. Between the surgery and the pain medications, it is common to experience some constipation. Increasing fluid intake and taking a fiber supplement (such as Metamucil, Citrucel, FiberCon, MiraLax, etc) 1-2 times a day regularly will usually help prevent this problem from occurring. A mild laxative (prune juice, Milk of Magnesia, MiraLax, etc) should be taken according to package directions if there are no bowel movements after 48 hours.  °5. Watch out for diarrhea. If you have many loose bowel movements, simplify your diet to bland foods & liquids for a few days. Stop any stool softeners and decrease your fiber supplement. Switching to mild anti-diarrheal medications (Kayopectate, Pepto Bismol) can help. If this worsens or does not improve, please call us. °6. Wash / shower every day. You may shower over the dressings as they are waterproof. Continue to shower over incision(s) after the dressing is off. If there is glue over the incisions try not to pick it off, let it fall off naturally. °7. Remove your waterproof bandages 2 days after surgery. You may leave the incision open to air. You may replace a dressing/Band-Aid to cover the incision for comfort if you wish.  °8. ACTIVITIES as tolerated:  °  1. You may resume regular (light) daily activities beginning the next day--such as daily self-care, walking, climbing stairs--gradually increasing activities as tolerated. If you can walk 30 minutes without difficulty, it is safe to try more intense activity such as jogging, treadmill, bicycling, low-impact aerobics, swimming, etc. °2. Save the most intensive and  strenuous activity for last such as sit-ups, heavy lifting, contact sports, etc Refrain from any heavy lifting or straining until you are off narcotics for pain control. For the first 2-3 weeks do not lift over 10-15lb.  °3. DO NOT PUSH THROUGH PAIN. Let pain be your guide: If it hurts to do something, don't do it. Pain is your body warning you to avoid that activity for another week until the pain goes down. °4. You may drive when you are no longer taking prescription pain medication, you can comfortably wear a seatbelt, and you can safely maneuver your car and apply brakes. °5. You may have sexual intercourse when it is comfortable.  °9. FOLLOW UP in our office  °1. Please call [facility] at [phone] to set up an appointment to see your surgeon in the office for a follow-up appointment approximately 2-3 weeks after your surgery. °2. Make sure that you call for this appointment the day you arrive home to insure a convenient appointment time. °     10. IF YOU HAVE DISABILITY OR FAMILY LEAVE FORMS, BRING THEM TO THE               OFFICE FOR PROCESSING.  ° °WHEN TO CALL US [phone]:  °1. Poor pain control °2. Reactions / problems with new medications (rash/itching, nausea, etc)  °3. Fever over 101.5 F (38.5 C) °4. Inability to urinate °5. Nausea and/or vomiting °6. Worsening swelling or bruising °7. Continued bleeding from incision. °8. Increased pain, redness, or drainage from the incision ° °The clinic staff is available to answer your questions during regular business hours (8:30am-5pm). Please don’t hesitate to call and ask to speak to one of our nurses for clinical concerns.  °If you have a medical emergency, go to the nearest emergency room or call 911.  °A surgeon from [facility] is always on call at the hospitals  ° °[facility]  °[address], [address], [place], [place] [address] ?  °MAIN: [phone] ? TOLL FREE: [phone] ?  °FAX [phone]    °[email or web address] ° ° ° ° °Laparoscopic Appendectomy, Adult, Care After °Refer to this sheet in the next few weeks. These instructions provide you with information about caring for yourself after your procedure. Your health care provider may also give you more specific instructions. Your treatment has been planned according to current medical practices, but problems sometimes occur. Call your health care provider if you have any problems or questions after your procedure. °What can I expect after the procedure? °After the procedure, it is common to have: °· A decrease in your energy level. °· Mild pain in the area where the surgical cuts (incisions) were made. °· Constipation. This can be caused by pain medicine and a decrease in your activity. ° °Follow these instructions at home: °Medicines °· Take over-the-counter and prescription medicines only as told by your health care provider. °· Do not drive for 24 hours if you received a sedative. °· Do not drive or operate heavy machinery while taking prescription pain medicine. °· If you were prescribed an antibiotic medicine, take it as told by your health care provider. Do   not stop taking the antibiotic even if you start to feel better. °Activity °· For 3 weeks or as long as told by your health care provider: °? Do not lift anything that is heavier than 10 pounds (4.5 kg). °? Do not play contact sports. °· Gradually return to your normal activities. Ask your health care provider what activities are safe for you. °Bathing °· Keep your incisions clean and dry. Clean them as often as told by your health care provider: °? Gently wash the incisions with soap and water. °? Rinse the incisions with water to remove all soap. °? Pat the incisions dry with a clean towel. Do not rub the incisions. °· You may take showers after 48 hours. °· Do not take baths, swim, or use hot tubs for 2 weeks or as told by your health care provider. °Incision care °· Follow  instructions from your healthcare provider about how to take care of your incisions. Make sure you: °? Wash your hands with soap and water before you change your bandage (dressing). If soap and water are not available, use hand sanitizer. °? Change your dressing as told by your health care provider. °? Leave stitches (sutures), skin glue, or adhesive strips in place. These skin closures may need to stay in place for 2 weeks or longer. If adhesive strip edges start to loosen and curl up, you may trim the loose edges. Do not remove adhesive strips completely unless your health care provider tells you to do that. °· Check your incision areas every day for signs of infection. Check for: °? More redness, swelling, or pain. °? More fluid or blood. °? Warmth. °? Pus or a bad smell. °Other Instructions °· If you were sent home with a drain, follow instructions from your health care provider about how to care for the drain and how to empty it. °· Take deep breaths. This helps to prevent your lungs from becoming inflamed. °· To relieve and prevent constipation: °? Drink plenty of fluids. °? Eat plenty of fruits and vegetables. °· Keep all follow-up visits as told by your health care provider. This is important. °Contact a health care provider if: °· You have more redness, swelling, or pain around an incision. °· You have more fluid or blood coming from an incision. °· Your incision feels warm to the touch. °· You have pus or a bad smell coming from an incision or dressing. °· Your incision edges break open after your sutures have been removed. °· You have increasing pain in your shoulders. °· You feel dizzy or you faint. °· You develop shortness of breath. °· You keep feeling nauseous or vomiting. °· You have diarrhea or you cannot control your bowel functions. °· You lose your appetite. °· You develop swelling or pain in your legs. °Get help right away if: °· You have a fever. °· You develop a rash. °· You have difficulty  breathing. °· You have sharp pains in your chest. °This information is not intended to replace advice given to you by your health care provider. Make sure you discuss any questions you have with your health care provider. °Document Released: 10/23/2005 Document Revised: 03/24/2016 Document Reviewed: 04/12/2015 °Elsevier Interactive Patient Education © 2017 [employer] ° °

## 2017-10-01 NOTE — Op Note (Signed)
OPERATIVE REPORT - LAPAROSCOPIC APPENDECTOMY  Preop diagnosis:  Acute appendicitis  Postop diagnosis:  Acute appendicitis with perforation  Procedure:  Laparoscopic appendectomy with drain placement  Surgeon:  Darnell Levelodd Matson Welch, MD  Anesthesia:  general endotracheal  Estimated blood loss:  minimal  Preparation:  Chlora-prep  Complications:  none  Indications:  patient is a 66 yo WM referred by Dr. Alona BeneJoshua Long at [facility] for surgical management of acute appendicitis.  Patient presented with 2 day hx of abd pain localizing to the RLQ.  Denies nausea or emesis, but anorexia present.  Denies fever or chills.  No prior abdominal surgery.  WBC 13K.  CTA positive for acute appendicitis.  [facility] and preparation for surgery.  Procedure:  Patient is brought to the operating room and placed in a supine position on the operating room table. Following administration of general anesthesia, a time out was held and the patient's name and procedure is confirmed. Patient is then prepped and draped in the usual strict aseptic fashion.  After ascertaining that an adequate level of anesthesia has been achieved, a peri-umbilical incision is made with a #15 blade. Dissection is carried down to the fascia. Fascia is incised in the midline and the peritoneal cavity is entered cautiously. A #0-vicryl pursestring suture is placed in the fascia. An Hassan cannula is introduced under direct vision and secured with the pursestring suture. The abdomen is insufflated with carbon dioxide. The laparoscope is introduced and the abdomen is explored. Operative ports are placed in the right upper quadrant and left lower quadrant. The appendix is identified. It is acutely inflamed and adherent to the lateral cecum and retroperitoneum.  There is a perforation near the base of the appendix with visible fecoliths.  The mesoappendix is divided with the harmonic scalpel. Dissection is carried down to  the base of the appendix. The base of the appendix is dissected out clearing the junction with the cecal wall. Using an Endo-GIA stapler, the base of the appendix is transected at the junction with the cecal wall. There is good approximation of tissue along the staple line. There is good hemostasis along the staple line. The appendix is placed into an endo-catch bag as are two fecoliths and withdrawn through the umbilical port. The #0-vicryl pursestring suture is tied securely.  Right lower quadrant is irrigated with warm saline which is evacuated. A 19Fr Blake drain is brought into the abdomen and exited through the right subcostal port site.  It is secured to the skin with a 2-0 Nylon suture.  Good hemostasis is noted. Ports are removed under direct vision. Good hemostasis is noted at the port sites. Pneumoperitoneum is released.  Skin incisions are anesthetized with local anesthetic. Wounds are closed with interrupted 4-0 Monocryl subcuticular sutures. Wounds are washed and dried and Steri-Strips are applied. Dressings are applied. The patient is awakened from anesthesia and brought to the recovery room. The patient tolerated the procedure well.  Darnell Levelodd Henrique Parekh, MD [facility] Office: [phone]

## 2017-10-01 NOTE — Transfer of Care (Signed)
Immediate Anesthesia Transfer of Care Note  Patient: Frederick Chen  Procedure(s) Performed: APPENDECTOMY LAPAROSCOPIC (N/A Abdomen)  Patient Location: PACU  Anesthesia Type:General  Level of Consciousness: awake, alert , oriented and patient cooperative  Airway & Oxygen Therapy: Patient Spontanous Breathing and Patient connected to face mask oxygen  Post-op Assessment: Report given to RN, Post -op Vital signs reviewed and stable and Patient moving all extremities X 4  Post vital signs: stable  Last Vitals:  Vitals:   09/30/17 2104 10/01/17 0009  BP: 140/72 (!) 101/57  Pulse: 80 78  Resp: 18 13  Temp: 37.3 C 36.9 C  SpO2: 94% 97%    Last Pain:  Vitals:   10/01/17 0009  TempSrc:   PainSc: 0-No pain         Complications: No apparent anesthesia complications

## 2017-10-01 NOTE — OR Nursing (Signed)
Patient report given to Junior, RN Patient VS stable, patient has no complaints at this time. Advised Junior, RN that patient has valuables with security.  Junior is aware of key and advised to give key to Editor, commissioningJenine unit secretary.  Jenine placed key and itemized list of belongings into patient chart.

## 2017-10-01 NOTE — Anesthesia Postprocedure Evaluation (Signed)
Anesthesia Post Note  Patient: Frederick PrudeDavid P Chen  Procedure(s) Performed: APPENDECTOMY LAPAROSCOPIC (N/A Abdomen)     Patient location during evaluation: PACU Anesthesia Type: General Level of consciousness: sedated Pain management: pain level controlled Vital Signs Assessment: post-procedure vital signs reviewed and stable Respiratory status: spontaneous breathing and respiratory function stable Cardiovascular status: stable Postop Assessment: no apparent nausea or vomiting Anesthetic complications: no    Last Vitals:  Vitals:   10/01/17 0410 10/01/17 0546  BP: 123/70 127/75  Pulse: 70 73  Resp: 16 16  Temp: 37.2 C 36.7 C  SpO2: 96% 98%    Last Pain:  Vitals:   10/01/17 0546  TempSrc: Oral  PainSc:                  Yogesh Cominsky DANIEL

## 2017-10-01 NOTE — Progress Notes (Signed)
Patient ID: Frederick PrudeDavid P Castrellon, male   DOB: 12/16/1950, 66 y.o.   MRN: [id number]  [facility]  1 Day Post-Op  Subjective: CC-  Abdomen sore but pain significantly improved from prior to surgery. Tolerating clear liquids and feels hungry. Denies n/v. No BM or flatus.  Has gotten OOB to go to the bathroom.  Lives home alone. 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  Objective: Vital signs in last 24 hours: Temp:  [98 F (36.7 C)-99.7 F (37.6 C)] 98 F (36.7 C) (11/26 0546) Pulse Rate:  [64-83] 73 (11/26 0546) Resp:  [13-18] 16 (11/26 0546) BP: (94-148)/(57-91) 127/75 (11/26 0546) SpO2:  [91 %-100 %] 98 % (11/26 0546) Weight:  [206 lb 5.6 oz (93.6 kg)] 206 lb 5.6 oz (93.6 kg) (11/26 0116)    Intake/Output from previous day: 11/25 0701 - 11/26 0700 In: 3535 [P.O.:360; I.V.:1875; IV Piggyback:300] Out: 575 [Urine:375; Drains:150; Blood:50] Intake/Output this shift: No intake/output data recorded.  PE: Gen:  Alert, NAD, pleasant HEENT: EOM's intact, pupils equal and round Card:  RRR, no M/G/R heard Pulm:  CTAB, no W/R/R, effort normal Abd: Soft, mild distension, mild global TTP, +BS, multiple lap incisions with cdi dressings in place, drain with serosanguinous drainage Psych: A&Ox3  Skin: no rashes noted, warm and dry  Lab Results:  Recent Labs    09/30/17 1515  WBC 13.2*  HGB 15.7  HCT 45.5  PLT 142*   BMET Recent Labs    09/30/17 1515  NA 137  K 4.2  CL 103  CO2 26  GLUCOSE 110*  BUN 14  CREATININE 0.84  CALCIUM 9.2   PT/INR No results for input(s): LABPROT, INR in the last 72 hours. CMP     Component Value Date/Time   NA 137 09/30/2017 1515   K 4.2 09/30/2017 1515   CL 103 09/30/2017 1515   CO2 26 09/30/2017 1515   GLUCOSE 110 (H) 09/30/2017 1515   BUN 14 09/30/2017 1515   CREATININE 0.84 09/30/2017 1515   CALCIUM 9.2 09/30/2017 1515   PROT 7.6 09/30/2017 1515   ALBUMIN 4.0 09/30/2017 1515   AST 22  09/30/2017 1515   ALT 18 09/30/2017 1515   ALKPHOS 89 09/30/2017 1515   BILITOT 0.9 09/30/2017 1515   GFRNONAA >60 09/30/2017 1515   GFRAA >60 09/30/2017 1515   Lipase     Component Value Date/Time   LIPASE 36 09/30/2017 1515       Studies/Results: Ct Abdomen Pelvis W Contrast  Result Date: 09/30/2017 CLINICAL DATA:  Right lower quadrant pain times several days with tenderness to palpation, decreased appetite, fever and leukocytosis. EXAM: CT ABDOMEN AND PELVIS WITH CONTRAST TECHNIQUE: Multidetector CT imaging of the abdomen and pelvis was performed using the standard protocol following bolus administration of intravenous contrast. CONTRAST:  100mL ISOVUE-300 IOPAMIDOL (ISOVUE-300) INJECTION 61% COMPARISON:  05/04/2016 FINDINGS: Lower chest: Heart size is normal with minimal coronary arteriosclerosis. Mild distal esophageal thickening appears chronic and may reflect chronic esophagitis. There is dependent atelectasis at the lung bases. Tiny subpleural nodular opacities are seen within the lung bases more so the right, nonspecific possibly postinfectious or post inflammatory in etiology, the largest are approximately 4 mm. Hepatobiliary: Homogeneous enhancement of the liver. There appear to be tiny dependent densities within the gallbladder that are suspicious for stones and/or biliary sludge. No biliary dilatation. Pancreas: No ductal dilatation or inflammation of the pancreas. No focal mass. Spleen: Normal size spleen without mass. Adrenals/Urinary  Tract: Mild cortical thinning of both kidneys along the midportion. No obstructive uropathy or enhancing masses. No adrenal mass. Physiologic distention of the urinary bladder. Mild sympathetic thickening along the anterior hand right lateral wall likely secondary to adjacent inflammatory disease from appendicitis. Stomach/Bowel: Extensive right lower quadrant mesenteric fatty inflammatory change with trace right lower quadrant and hemi pelvic free  fluid is identified without enhancement. This is secondary to acute appendicitis. Appendix: Location: Right lower quadrant at McBurney's point Diameter: 13 mm Appendicolith: Present along its proximal to midportion measuring up to 17 mm in length by 9 mm in thickness. Mucosal hyper-enhancement: Present Extraluminal gas: None Periappendiceal collection: None There is sigmoid diverticulosis without acute diverticulitis. Vascular/Lymphatic: Moderate aortoiliac atherosclerosis. No lymphadenopathy by CT size criteria. Reproductive: Normal size prostate with central zone calcifications. Other: Omental fat and fluid containing right inguinal canal. Musculoskeletal: Lumbar spondylosis with marked degenerative disc disease and vacuum disc phenomenon. No acute nor suspicious osseous abnormality. IMPRESSION: 1. Moderate-to-marked periappendiceal mesenteric inflammation secondary to acute appendicitis. The appendix measures up to 13 mm and contains hyperdense material within consistent and appendicular as above described. No perforation or definite enhancing fluid to suggest abscess. 2. Mild renal cortical thinning.  No obstructive uropathy. 3. Chronic mild distal esophageal thickening question esophagitis. 4. Lumbar spondylosis without acute osseous abnormality. 5. Faint hyperdensities along the dependent aspect of the gallbladder are suggested. Findings are suspicious for small gallstones and biliary sludge. 6. Omental fat and fluid containing right inguinal hernia. Electronically Signed   By: Tollie Ethavid  Kwon M.D.   On: 09/30/2017 18:27    Anti-infectives: Anti-infectives (From admission, onward)   Start     Dose/Rate Route Frequency Ordered Stop   10/01/17 0600  piperacillin-tazobactam (ZOSYN) IVPB 3.375 g  Status:  Discontinued     3.375 g 12.5 mL/hr over 240 Minutes Intravenous Every 8 hours 09/30/17 2132 10/01/17 0110   10/01/17 0145  piperacillin-tazobactam (ZOSYN) IVPB 3.375 g     3.375 g 12.5 mL/hr over 240  Minutes Intravenous Every 8 hours 10/01/17 0138     10/01/17 0115  piperacillin-tazobactam (ZOSYN) IVPB 3.375 g  Status:  Discontinued     3.375 g 12.5 mL/hr over 240 Minutes Intravenous Every 8 hours 10/01/17 0110 10/01/17 0138   10/01/17 0000  piperacillin-tazobactam (ZOSYN) IVPB 3.375 g  Status:  Discontinued     3.375 g 100 mL/hr over 30 Minutes Intravenous  Once 09/30/17 2145 10/01/17 0110   09/30/17 1845  piperacillin-tazobactam (ZOSYN) IVPB 3.375 g     3.375 g 100 mL/hr over 30 Minutes Intravenous  Once 09/30/17 1835 09/30/17 1923       Assessment/Plan HLD HTN Gout GERD  Perforated appendicitis S/p laparoscopic appendectomy 11/26 Dr. Gerrit FriendsGerkin - POD 0 - drain with 70cc serosanguinous output - TMAX 99.7  ID - zosyn 11/25>>day#2 FEN - decrease IVF, FLD (advance as tolerated) VTE - SCDs, lovenox Foley - out Follow up - DOW clinic 2-3 weeks  Plan - Decrease IVF and advance to full liquids. Encourage ambulation today. Continue drain and IV antibiotics. Labs in AM.  Per Dr. Gerrit FriendsGerkin, drain should be able to be removed prior to discharge.   LOS: 0 days    Franne FortsBrooke A Meuth , [facility] 10/01/2017, 9:23 AM Pager: [phone] Consults: [phone] Mon-Fri 7:00 am-4:30 pm Sat-Sun 7:00 am-11:30 am

## 2017-10-02 LAB — CBC
HCT: 38.1 % — ABNORMAL LOW (ref 39.0–52.0)
Hemoglobin: 12.8 g/dL — ABNORMAL LOW (ref 13.0–17.0)
MCH: 33.2 pg (ref 26.0–34.0)
MCHC: 33.6 g/dL (ref 30.0–36.0)
MCV: 98.7 fL (ref 78.0–100.0)
PLATELETS: 129 10*3/uL — AB (ref 150–400)
RBC: 3.86 MIL/uL — AB (ref 4.22–5.81)
RDW: 14.3 % (ref 11.5–15.5)
WBC: 11.3 10*3/uL — ABNORMAL HIGH (ref 4.0–10.5)

## 2017-10-02 LAB — BASIC METABOLIC PANEL
Anion gap: 7 (ref 5–15)
BUN: 11 mg/dL (ref 6–20)
CO2: 27 mmol/L (ref 22–32)
CREATININE: 0.81 mg/dL (ref 0.61–1.24)
Calcium: 8.6 mg/dL — ABNORMAL LOW (ref 8.9–10.3)
Chloride: 105 mmol/L (ref 101–111)
GFR calc Af Amer: >60 mL/min (ref >60)
GFR calc non Af Amer: >60 mL/min (ref >60)
GLUCOSE: 120 mg/dL — AB (ref 65–99)
POTASSIUM: 3.7 mmol/L (ref 3.5–5.1)
Sodium: 139 mmol/L (ref 135–145)

## 2017-10-02 MED ORDER — SACCHAROMYCES BOULARDII 250 MG PO CAPS
250.0000 mg | ORAL_CAPSULE | Freq: Two times a day (BID) | ORAL | Status: DC
  Administered 2017-10-02 – 2017-10-03 (×3): 250 mg via ORAL
  Filled 2017-10-02 (×3): qty 1

## 2017-10-02 NOTE — Progress Notes (Signed)
2 Days Post-Op    CC: Abdominal pain  Subjective: Patient up in bed.  He was advanced to a regular diet this a.m.  But could not eat very much.  He still feels fairly bloated.  He is ambulating some and feels good doing this.  Drain site looks good.  Drainage is bloody in appearance.  The bulb was about half full right now.  He lives alone.   Objective: Vital signs in last 24 hours: Temp:  [98.2 F (36.8 C)-99.2 F (37.3 C)] 98.2 F (36.8 C) (11/27 0635) Pulse Rate:  [64-87] 64 (11/27 0635) Resp:  [16-18] 18 (11/27 0635) BP: (107-124)/(65-74) 124/74 (11/27 0635) SpO2:  [94 %-96 %] 95 % (11/27 0635)   240 PO 1300 IV Urine x 4 Drain 15 WBC trending down to 11.3 hemoglobin/hematocrit: down slightly, platelets 129,000 Afebrile vital signs are stable CT scan 09/30/17: Moderate to marked periappendiceal mesenteric inflammation secondary to acute appendicitis appendix up to 13 mm with possible abscess.  Mild renal cortical thinning.  Chronic mid to distal esophageal thickening question of esophagitis.  Possible gallstones and sludge.  Omental fat and fluid containing right inguinal hernia.   Intake/Output from previous day: 11/26 0701 - 11/27 0700 In: 1526.7 [P.O.:240; I.V.:1036.7; IV Piggyback:150] Out: 15 [Drains:15] Intake/Output this shift: Total I/O In: 240 [P.O.:240] Out: -   General appearance: alert, cooperative and no distress Resp: clear to auscultation bilaterally GI: Still somewhat distended.  Port sites look good.  Few bowel sounds hypoactive.  Abdominal discomfort is stable for postop appendectomy.  Lab Results:  Recent Labs    09/30/17 1515 10/02/17 0515  WBC 13.2* 11.3*  HGB 15.7 12.8*  HCT 45.5 38.1*  PLT 142* 129*    BMET Recent Labs    09/30/17 1515 10/02/17 0515  NA 137 139  K 4.2 3.7  CL 103 105  CO2 26 27  GLUCOSE 110* 120*  BUN 14 11  CREATININE 0.84 0.81  CALCIUM 9.2 8.6*   PT/INR No results for input(s): LABPROT, INR in the last 72  hours.  Recent Labs  Lab 09/30/17 1515  AST 22  ALT 18  ALKPHOS 89  BILITOT 0.9  PROT 7.6  ALBUMIN 4.0     Lipase     Component Value Date/Time   LIPASE 36 09/30/2017 1515   Prior to Admission medications   Medication Sig Start Date End Date Taking? Authorizing Provider  allopurinol (ZYLOPRIM) 100 MG tablet Take 100 mg by mouth daily.   Yes [provider]  amLODipine (NORVASC) 2.5 MG tablet Take 2.5 mg by mouth at bedtime. 08/01/17  Yes [provider]  aspirin EC 81 MG tablet Take 81 mg by mouth at bedtime.   Yes [provider]  atorvastatin (LIPITOR) 40 MG tablet Take 40 mg by mouth daily.   Yes [provider]  Choline Fenofibrate (TRILIPIX) 135 MG capsule Take 135 mg by mouth daily.   Yes [provider]  famotidine (PEPCID) 20 MG tablet Take 20 mg by mouth 2 (two) times daily.   Yes [provider]  fish oil-omega-3 fatty acids 1000 MG capsule Take 2 g by mouth 2 (two) times daily.   Yes [provider]  loratadine (CLARITIN) 10 MG tablet Take 10 mg by mouth daily.   Yes [provider]  metoprolol (LOPRESSOR) 100 MG tablet Take 100 mg by mouth 2 (two) times daily.   Yes [provider]  Polyvinyl Alcohol-Povidone (MURINE TEARS FOR DRY EYES) 5-6 MG/ML  SOLN Apply 1 drop to eye daily as needed (dry eyes).   Yes [provider]  quinapril (ACCUPRIL) 40 MG tablet Take 40 mg by mouth daily.   Yes [provider]  FLUZONE HIGH-DOSE 0.5 ML injection Inject 0.5 mLs into the muscle once. 08/27/17   [provider]     Medications: . allopurinol  100 mg Oral Daily  . amLODipine  2.5 mg Oral QHS  . enoxaparin (LOVENOX) injection  40 mg Subcutaneous Q24H  . famotidine  20 mg Oral BID  . lisinopril  40 mg Oral Daily  . metoprolol tartrate  100 mg Oral BID   . dextrose 5 % and 0.45 % NaCl with KCl 20 mEq/L 50 mL/hr at 10/01/17 0958  . piperacillin-tazobactam (ZOSYN)  IV 3.375  g (10/02/17 0636)   Anti-infectives (From admission, onward)   Start     Dose/Rate Route Frequency Ordered Stop   10/01/17 0600  piperacillin-tazobactam (ZOSYN) IVPB 3.375 g  Status:  Discontinued     3.375 g 12.5 mL/hr over 240 Minutes Intravenous Every 8 hours 09/30/17 2132 10/01/17 0110   10/01/17 0145  piperacillin-tazobactam (ZOSYN) IVPB 3.375 g     3.375 g 12.5 mL/hr over 240 Minutes Intravenous Every 8 hours 10/01/17 0138     10/01/17 0115  piperacillin-tazobactam (ZOSYN) IVPB 3.375 g  Status:  Discontinued     3.375 g 12.5 mL/hr over 240 Minutes Intravenous Every 8 hours 10/01/17 0110 10/01/17 0138   10/01/17 0000  piperacillin-tazobactam (ZOSYN) IVPB 3.375 g  Status:  Discontinued     3.375 g 100 mL/hr over 30 Minutes Intravenous  Once 09/30/17 2145 10/01/17 0110   09/30/17 1845  piperacillin-tazobactam (ZOSYN) IVPB 3.375 g     3.375 g 100 mL/hr over 30 Minutes Intravenous  Once 09/30/17 1835 09/30/17 1923      Assessment/Plan Perforated appendicitis S/p laparoscopic appendectomy 11/26 Dr. Gerrit FriendsGerkin - POD 1 - drain with 70cc serosanguinous output post op yesterday, 15 ml recorded yesterday, but 1/2 full bulb now with bloody appearing drainage today - TMAX 99.1 - WBC 11.3   CT: Possible esophagitis/right inguinal hernia with omental fat.  HLD HTN Gout GERD   ID - zosyn 11/25>>day #3 FEN - decrease IVF, regular VTE - SCDs, lovenox Foley - out Follow up - DOW clinic 2-3 weeks  Plan: continue IV antibiotics, continue to mobilize.,  Continue IV fluids for now until his oral intake is better.  Monitor drain.  I will recheck labs in a.m.      LOS: 1 day    Clara Smolen 10/02/2017 [phone]

## 2017-10-02 NOTE — Discharge Summary (Signed)
Physician Discharge Summary  Patient ID: Frederick PrudeDavid P Yoon MRN: [id number] DOB/AGE: 66/02/1951 66 y.o.  Admit date: 09/30/2017 Discharge date: 10/03/2017  Admission Diagnoses:  Acute  Appendicitis with perforation HLD HTN Gout GERD    Discharge Diagnoses:  Same  Principal Problem:   Acute perforated appendicitis Active Problems:   Acute appendicitis   PROCEDURES:  laparoscopic appendectomy with drain placement, 11/26 Dr. Laurin CoderGerkin    Hospital Course:  patient is a 66 yo WM referred by Dr. Alona BeneJoshua Long at [facility] for surgical management of acute appendicitis.  Patient presented with 2 day hx of abd pain localizing to the RLQ.  Denies nausea or emesis, but anorexia present.  Denies fever or chills.  No prior abdominal surgery.  WBC 13K.  CTA positive for acute appendicitis.  [facility] and [facility] for surgery. [facility] that evening.  Post op he has done fairly well.  He has a drain that was rather bloody initially.  By 10/03/17, he is tolerating a diet.  Drainage is clear serosanguineous, he feels much better.  The obesity is back to normal, he is ambulating without difficulty and feels like he is ready for discharge.  CBC Latest Ref Rng & Units 10/03/2017 10/02/2017 09/30/2017  WBC 4.0 - 10.5 K/uL 8.4 11.3(H) 13.2(H)  Hemoglobin 13.0 - 17.0 g/dL 40.913.6 12.8(L) 15.7  Hematocrit 39.0 - 52.0 % 39.9 38.1(L) 45.5  Platelets 150 - 400 K/uL 151 129(L) 142(L)   CMP Latest Ref Rng & Units 10/03/2017 10/02/2017 09/30/2017  Glucose 65 - 99 mg/dL 89 811(B120(H) 147(W110(H)  BUN 6 - 20 mg/dL 13 11 14   Creatinine 0.61 - 1.24 mg/dL 2.950.80 6.210.81 3.080.84  Sodium 135 - 145 mmol/L 137 139 137  Potassium 3.5 - 5.1 mmol/L 3.6 3.7 4.2  Chloride 101 - 111 mmol/L 103 105 103  CO2 22 - 32 mmol/L 27 27 26   Calcium 8.9 - 10.3 mg/dL 6.5(H8.7(L) 8.4(O8.6(L) 9.2  Total Protein 6.5 - 8.1 g/dL - - 7.6  Total Bilirubin 0.3 - 1.2 mg/dL - - 0.9  Alkaline  Phos 38 - 126 U/L - - 89  AST 15 - 41 U/L - - 22  ALT 17 - 63 U/L - - 18   Appendix, Other than Incidental - ACUTE APPENDICITIS WITH EVIDENCE OF PERFORATION.   Condition on discharge: Improved  Disposition: 01-Home or Self Care   Allergies as of 10/03/2017      Reactions   Crestor [rosuvastatin Calcium]    Muscle weakness      Medication List    STOP taking these medications   FLUZONE HIGH-DOSE 0.5 ML injection Generic drug:  Influenza vac split quadrivalent PF     TAKE these medications   acetaminophen 325 MG tablet Commonly known as:  TYLENOL You can take 2 tablets every 4 hours as needed for pain.  You can alternate this with ibuprofen or Aleve. DO NOT TAKE MORE THAN 4000 MG OF TYLENOL PER DAY.  IT CAN HARM YOUR LIVER.  TYLENOL (ACETAMINOPHEN) IS ALSO IN YOUR PRESCRIPTION PAIN MEDICATION.  YOU HAVE TO COUNT IT IN YOUR DAILY TOTAL.   allopurinol 100 MG tablet Commonly known as:  ZYLOPRIM Take 100 mg by mouth daily.   amLODipine 2.5 MG tablet Commonly known as:  NORVASC Take 2.5 mg by mouth at bedtime.   amoxicillin-clavulanate 875-125 MG tablet Commonly known as:  AUGMENTIN Take 1 tablet by mouth 2 (two) times  daily.   aspirin EC 81 MG tablet Take 81 mg by mouth at bedtime.   atorvastatin 40 MG tablet Commonly known as:  LIPITOR Take 40 mg by mouth daily.   famotidine 20 MG tablet Commonly known as:  PEPCID Take 20 mg by mouth 2 (two) times daily.   fish oil-omega-3 fatty acids 1000 MG capsule Take 2 g by mouth 2 (two) times daily.   HYDROcodone-acetaminophen 5-325 MG tablet Commonly known as:  NORCO/VICODIN Take 1-2 tablets by mouth every 4 (four) hours as needed for moderate pain.   ibuprofen 200 MG tablet Commonly known as:  ADVIL,MOTRIN You can safely take 2-3 tablets every 6 hours as needed for pain.  You can use this first and alternate with plain Tylenol for pain control.  Reserve prescription pain medications for pain not relieved by Tylenol  and ibuprofen.   loratadine 10 MG tablet Commonly known as:  CLARITIN Take 10 mg by mouth daily.   metoprolol tartrate 100 MG tablet Commonly known as:  LOPRESSOR Take 100 mg by mouth 2 (two) times daily.   MURINE TEARS FOR DRY EYES 5-6 MG/ML Soln Generic drug:  Polyvinyl Alcohol-Povidone Apply 1 drop to eye daily as needed (dry eyes).   quinapril 40 MG tablet Commonly known as:  ACCUPRIL Take 40 mg by mouth daily.   saccharomyces boulardii 250 MG capsule Commonly known as:  FLORASTOR You can buy this over the counter at any drug store.  Follow package directions and use for the next month   TRILIPIX 135 MG capsule Generic drug:  Choline Fenofibrate Take 135 mg by mouth daily.      Follow-up Information    [facility] [place] Go on [facility]/16/2017.   Specialty:  General Surgery Why:  Your appointment is 10/16/2017 at 10AM. Please arrive 30 minutes prior to your appointment to check in and fill out paperwork. Bring photo ID. Dr. Gerrit FriendsGerkin did your surgery, if you need to see someone about your right hernia you can call. Contact information: [address] [place] [id number] [phone]       Albertina SenegalPollock, Nelson, MD Follow up.   Specialty:  Internal Medicine Why:  Call and let him know you had surgery.  Let them see you to review medicines and adjust for medical issues. Contact information: [address] [place] [address] [phone]           Signed: Sherrie GeorgeJENNINGS,Rahkim Rabalais 10/03/2017, 9:03 AM

## 2017-10-03 LAB — BASIC METABOLIC PANEL
ANION GAP: 7 (ref 5–15)
BUN: 13 mg/dL (ref 6–20)
CHLORIDE: 103 mmol/L (ref 101–111)
CO2: 27 mmol/L (ref 22–32)
CREATININE: 0.8 mg/dL (ref 0.61–1.24)
Calcium: 8.7 mg/dL — ABNORMAL LOW (ref 8.9–10.3)
GFR calc non Af Amer: >60 mL/min (ref >60)
Glucose, Bld: 89 mg/dL (ref 65–99)
POTASSIUM: 3.6 mmol/L (ref 3.5–5.1)
SODIUM: 137 mmol/L (ref 135–145)

## 2017-10-03 LAB — CBC
HCT: 39.9 % (ref 39.0–52.0)
HEMOGLOBIN: 13.6 g/dL (ref 13.0–17.0)
MCH: 33.5 pg (ref 26.0–34.0)
MCHC: 34.1 g/dL (ref 30.0–36.0)
MCV: 98.3 fL (ref 78.0–100.0)
Platelets: 151 10*3/uL (ref 150–400)
RBC: 4.06 MIL/uL — AB (ref 4.22–5.81)
RDW: 14.4 % (ref 11.5–15.5)
WBC: 8.4 10*3/uL (ref 4.0–10.5)

## 2017-10-03 MED ORDER — AMOXICILLIN-POT CLAVULANATE 875-125 MG PO TABS: 1.0000 | ORAL_TABLET | Freq: Two times a day (BID) | ORAL | 0 refills | Status: AC

## 2017-10-03 MED ORDER — SACCHAROMYCES BOULARDII 250 MG PO CAPS: ORAL_CAPSULE | ORAL | Status: AC

## 2017-10-03 MED ORDER — ACETAMINOPHEN 325 MG PO TABS: ORAL_TABLET | ORAL | Status: AC

## 2017-10-03 MED ORDER — HYDROCODONE-ACETAMINOPHEN 5-325 MG PO TABS: 1.0000 | ORAL_TABLET | ORAL | 0 refills | Status: AC | PRN

## 2017-10-03 MED ORDER — IBUPROFEN 200 MG PO TABS: ORAL_TABLET | ORAL | Status: AC

## 2017-10-03 NOTE — Progress Notes (Signed)
Pt discharged to home with family.  All belongings sent home with pt.  Education provided re: pain management, new medications, incisional care, when to call MD and follow up care.  Sundra AlandMaura S Tameria Patti, RN

## 2017-10-03 NOTE — Progress Notes (Signed)
3 Days Post-Op    CC:  Abdominal pain  Subjective: He is doing very well this a.m.,  Tolerating soft diet, pain well controlled.  Drainage from his JP is clear.  Overall he feels much better.   Objective: Vital signs in last 24 hours: Temp:  [98.1 F (36.7 C)-98.9 F (37.2 C)] 98.5 F (36.9 C) (11/28 0615) Pulse Rate:  [59-72] 59 (11/28 0615) Resp:  [18-20] 18 (11/28 0615) BP: (130-139)/(70-88) 139/88 (11/28 0615) SpO2:  [96 %-99 %] 96 % (11/28 0615)  480 PO 700 IV Voided x 8 Drain 75 Stool x 0 Afebrile, VSS WBC down to 8.4 Intake/Output from previous day: 11/27 0701 - 11/28 0700 In: 1180 [P.O.:480; I.V.:600; IV Piggyback:100] Out: 75 [Drains:75] Intake/Output this shift: No intake/output data recorded.  General appearance: alert, cooperative and no distress Resp: clear to auscultation bilaterally Abdomen: Soft, nontender positive bowel sounds some postop soreness.  Port sites all look good.  Drain is putting on just plain clear serous fluid.  Lab Results:  Recent Labs    10/02/17 0515 10/03/17 0536  WBC 11.3* 8.4  HGB 12.8* 13.6  HCT 38.1* 39.9  PLT 129* 151    BMET Recent Labs    10/02/17 0515 10/03/17 0536  NA 139 137  K 3.7 3.6  CL 105 103  CO2 27 27  GLUCOSE 120* 89  BUN 11 13  CREATININE 0.81 0.80  CALCIUM 8.6* 8.7*   PT/INR No results for input(s): LABPROT, INR in the last 72 hours.  Recent Labs  Lab 09/30/17 1515  AST 22  ALT 18  ALKPHOS 89  BILITOT 0.9  PROT 7.6  ALBUMIN 4.0     Lipase     Component Value Date/Time   LIPASE 36 09/30/2017 1515   Prior to Admission medications   Medication Sig Start Date End Date Taking? Authorizing Provider  allopurinol (ZYLOPRIM) 100 MG tablet Take 100 mg by mouth daily.   Yes [provider]  amLODipine (NORVASC) 2.5 MG tablet Take 2.5 mg by mouth at bedtime. 08/01/17  Yes [provider]  aspirin EC 81 MG tablet Take 81 mg by mouth at bedtime.   Yes [provider]  atorvastatin (LIPITOR) 40 MG tablet Take 40 mg by mouth daily.   Yes [provider]  Choline Fenofibrate (TRILIPIX) 135 MG capsule Take 135 mg by mouth daily.   Yes [provider]  famotidine (PEPCID) 20 MG tablet Take 20 mg by mouth 2 (two) times daily.   Yes [provider]  fish oil-omega-3 fatty acids 1000 MG capsule Take 2 g by mouth 2 (two) times daily.   Yes [provider]  loratadine (CLARITIN) 10 MG tablet Take 10 mg by mouth daily.   Yes [provider]  metoprolol (LOPRESSOR) 100 MG tablet Take 100 mg by mouth 2 (two) times daily.   Yes [provider]  Polyvinyl Alcohol-Povidone (MURINE TEARS FOR DRY EYES) 5-6 MG/ML SOLN Apply 1 drop to eye daily as needed (dry eyes).   Yes [provider]  quinapril (ACCUPRIL) 40 MG tablet Take 40 mg by mouth daily.   Yes [provider]  FLUZONE HIGH-DOSE 0.5 ML injection Inject 0.5 mLs into the muscle once. 08/27/17   [provider]      Medications: . allopurinol  100 mg Oral Daily  . amLODipine  2.5 mg Oral QHS  . enoxaparin (LOVENOX) injection  40 mg Subcutaneous Q24H  . famotidine  20 mg Oral BID  .  lisinopril  40 mg Oral Daily  . metoprolol tartrate  100 mg Oral BID  . saccharomyces boulardii  250 mg Oral BID   Anti-infectives (From admission, onward)   Start     Dose/Rate Route Frequency Ordered Stop   10/01/17 0600  piperacillin-tazobactam (ZOSYN) IVPB 3.375 g  Status:  Discontinued     3.375 g 12.5 mL/hr over 240 Minutes Intravenous Every 8 hours 09/30/17 2132 10/01/17 0110   10/01/17 0145  piperacillin-tazobactam (ZOSYN) IVPB 3.375 g     3.375 g 12.5 mL/hr over 240 Minutes Intravenous Every 8 hours 10/01/17 0138     10/01/17 0115  piperacillin-tazobactam (ZOSYN) IVPB 3.375 g  Status:  Discontinued     3.375 g 12.5 mL/hr over 240 Minutes Intravenous Every 8 hours 10/01/17 0110 10/01/17 0138   10/01/17 0000  piperacillin-tazobactam (ZOSYN)  IVPB 3.375 g  Status:  Discontinued     3.375 g 100 mL/hr over 30 Minutes Intravenous  Once 09/30/17 2145 10/01/17 0110   09/30/17 1845  piperacillin-tazobactam (ZOSYN) IVPB 3.375 g     3.375 g 100 mL/hr over 30 Minutes Intravenous  Once 09/30/17 1835 09/30/17 1923      Assessment/Plan Perforated appendicitis S/p laparoscopic appendectomy 11/26 Dr. Gerrit FriendsGerkin -POD 2 - 70 ml yesterday - TMAX 98.9 - WBC 8.4  CT: Possible esophagitis/right inguinal hernia with omental fat.  HLD HTN Gout GERD   ID -zosyn 11/25>>day #4 FEN -decrease IVF, regular VTE -SCDs, lovenox Foley -out Follow up -DOW clinic 2-3 weeks  Plan: Follow-up in clinic in 2 weeks.,  Probiotics and 7 more days of Augmentin.    LOS: 2 days    Josian Lanese 10/03/2017 [phone]

## 2018-09-26 IMAGING — CT CT ABD-PELV W/ CM
2 of 5 series · 15 of 46 positions shown, 17 images · IV contrast (APPLIED)
Comparison: 05/04/2016

CLINICAL DATA: Right lower quadrant pain times several days with
tenderness to palpation, decreased appetite, fever and leukocytosis.

EXAM:
CT ABDOMEN AND PELVIS WITH CONTRAST
TECHNIQUE: Multidetector CT imaging of the abdomen and pelvis was performed
using the standard protocol following bolus administration of
intravenous contrast.
CONTRAST:  100mL B2RAMM-ETT IOPAMIDOL (B2RAMM-ETT) INJECTION 61%

[Series 2: axial st · axial · 0.79mm/px · z∈[+344,+789]mm · 12 of 101 slices shown, 14 images]
[im 6/101  soft-tissue]
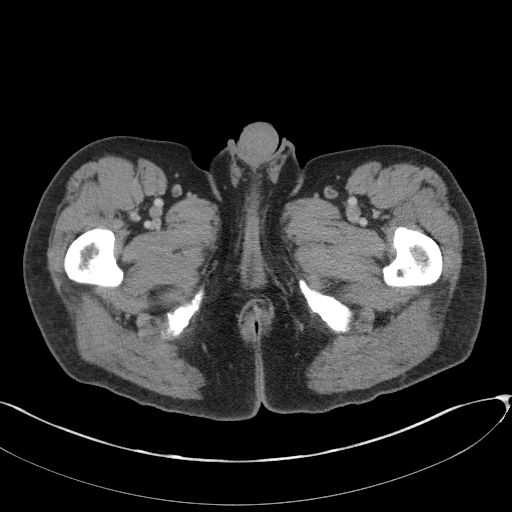
[im 6/101  bone]
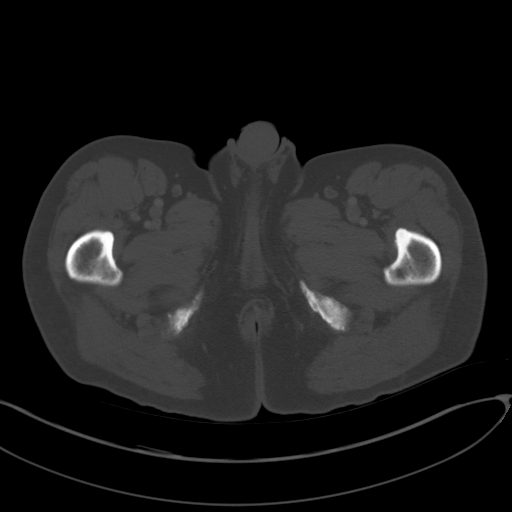
[im 17/101  soft-tissue]
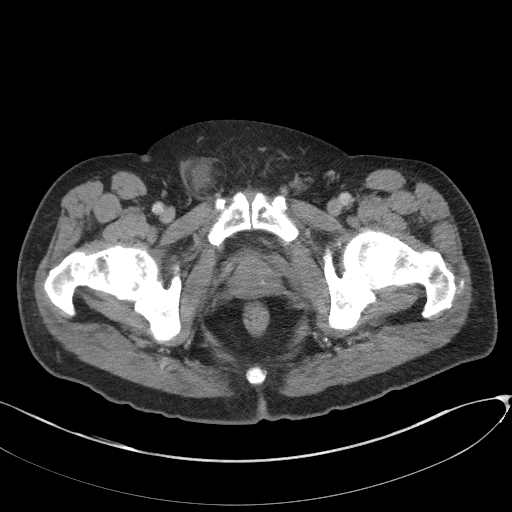
[im 23/101  soft-tissue]
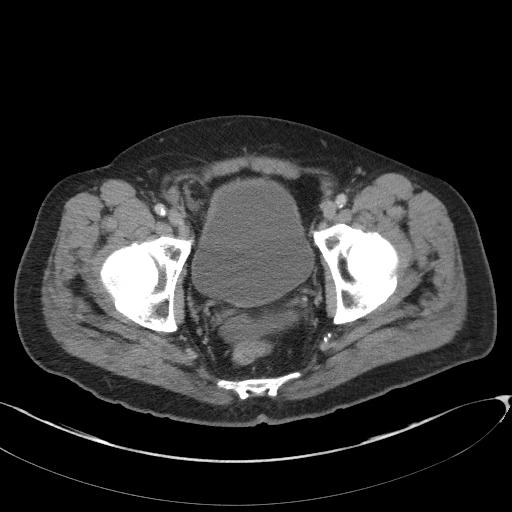
[im 28/101  soft-tissue]
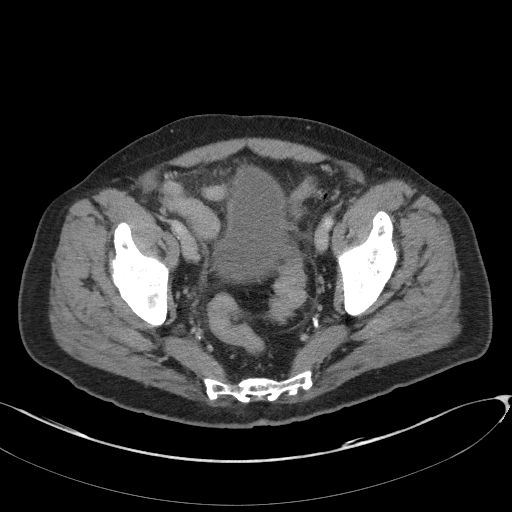
[im 39/101  soft-tissue]
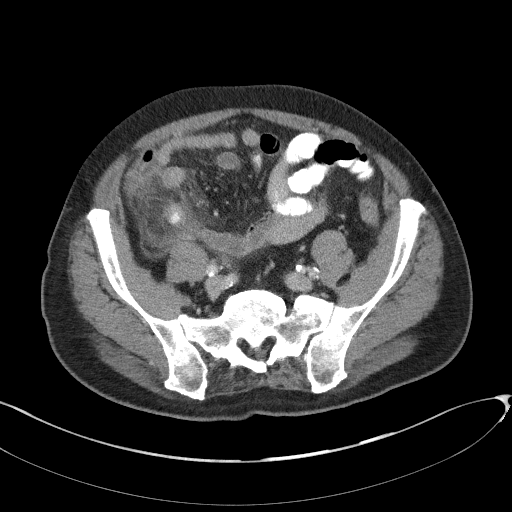
[im 45/101  soft-tissue]
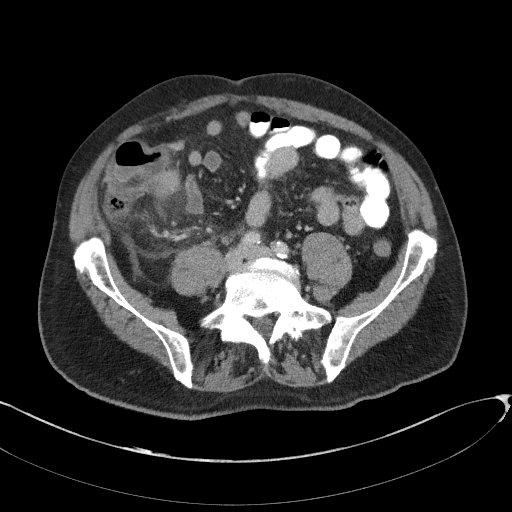
[im 56/101  soft-tissue]
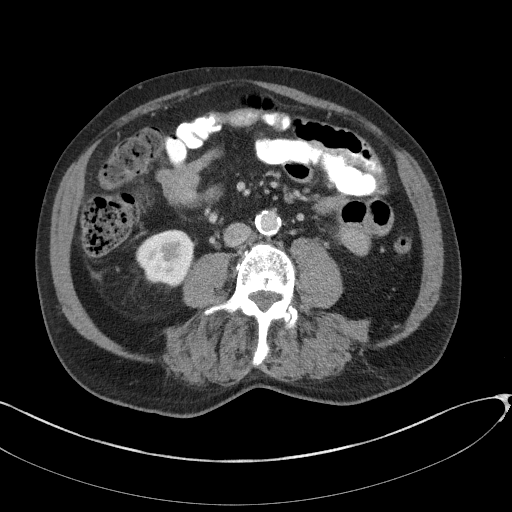
[im 62/101  soft-tissue]
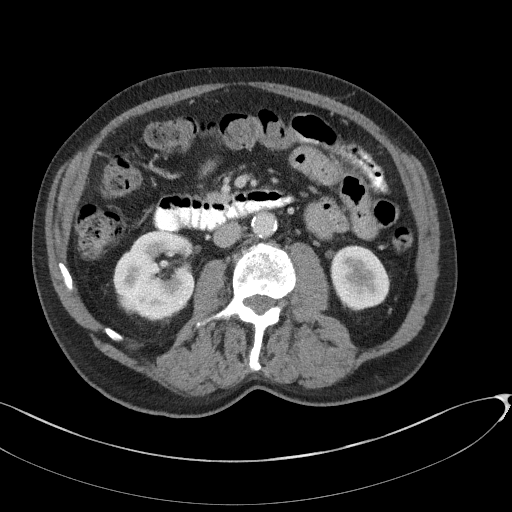
[im 73/101  soft-tissue]
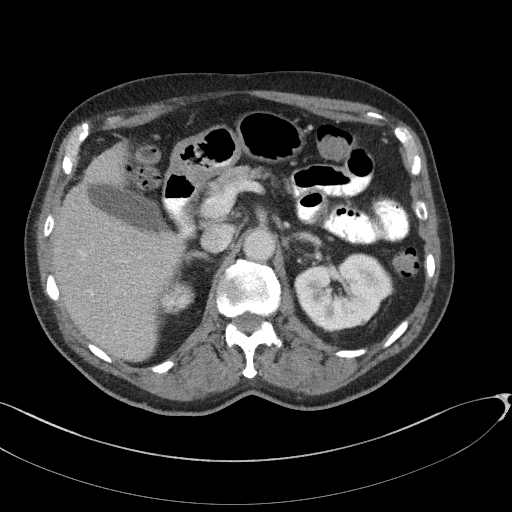
[im 73/101  bone]
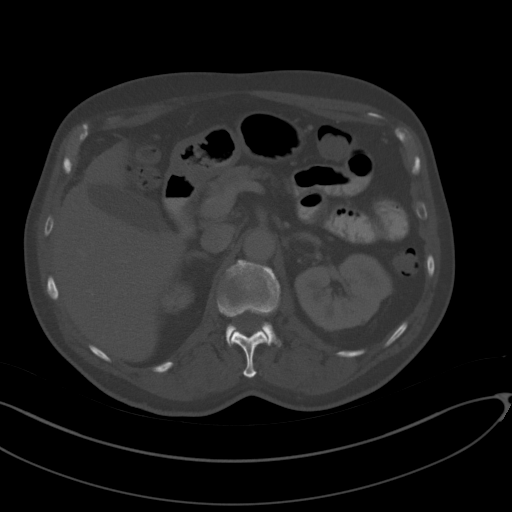
[im 78/101  soft-tissue]
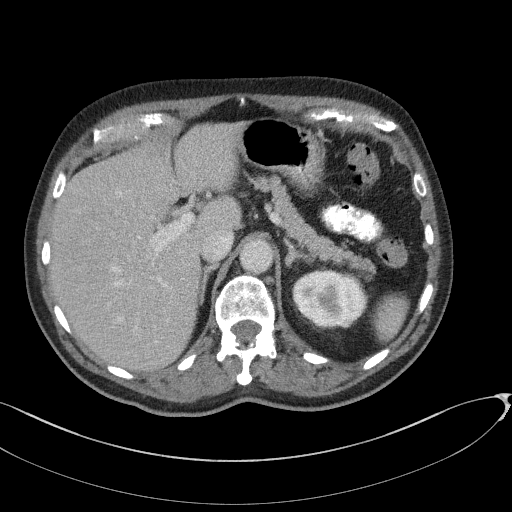
[im 84/101  soft-tissue]
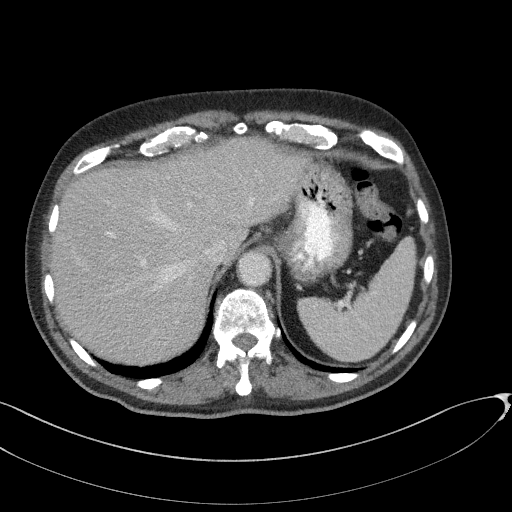
[im 95/101  soft-tissue]
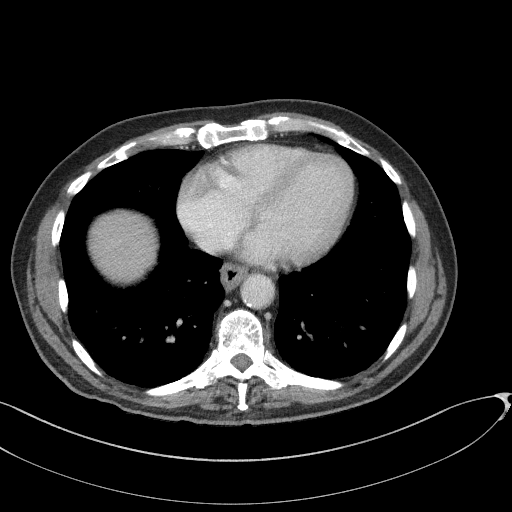

[Series 5: coronal st · coronal · 0.75mm/px · 3 of 100 slices shown]
[im 34/100  soft-tissue]
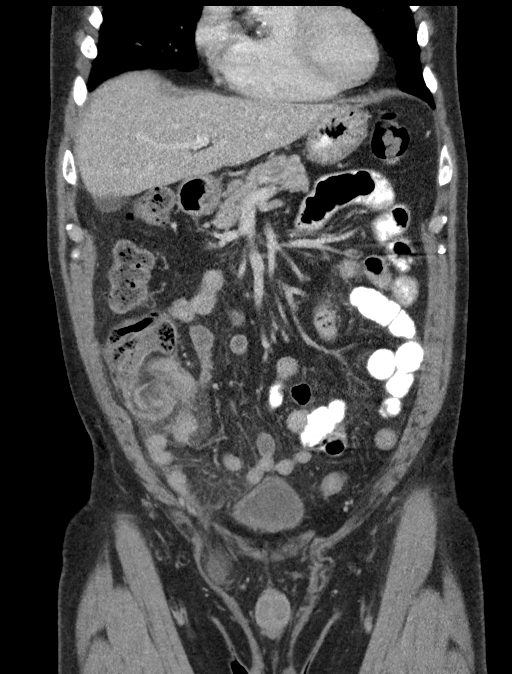
[im 45/100  soft-tissue]
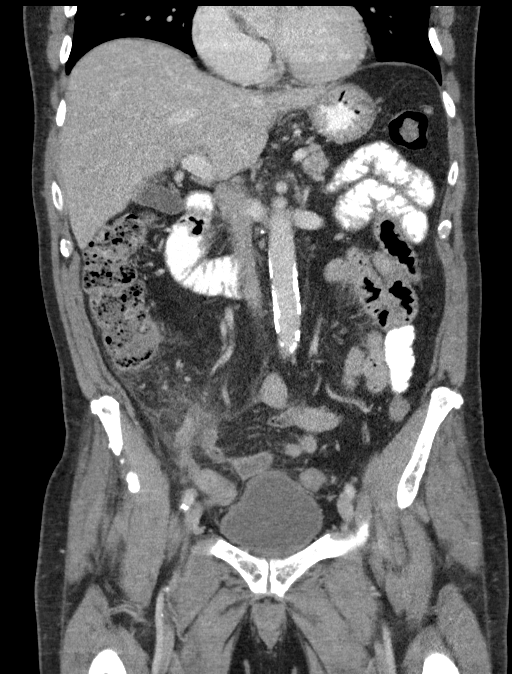
[im 56/100  soft-tissue]
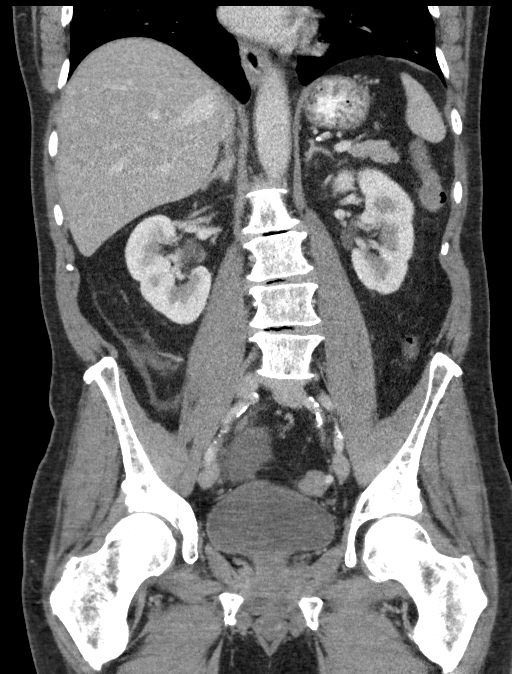

[15 of 46 positions shown; findings below may reference images not displayed]

FINDINGS: Lower chest: Heart size is normal with minimal coronary
arteriosclerosis. Mild distal esophageal thickening appears chronic
and may reflect chronic esophagitis. There is dependent atelectasis
at the lung bases. Tiny subpleural nodular opacities are seen within
the lung bases more so the right, nonspecific possibly
postinfectious or post inflammatory in etiology, the largest are
approximately 4 mm.

Hepatobiliary: Homogeneous enhancement of the liver. There appear to
be tiny dependent densities within the gallbladder that are
suspicious for stones and/or biliary sludge. No biliary dilatation.

Pancreas: No ductal dilatation or inflammation of the pancreas. No
focal mass.

Spleen: Normal size spleen without mass.

Adrenals/Urinary Tract: Mild cortical thinning of both kidneys along
the midportion. No obstructive uropathy or enhancing masses. No
adrenal mass. Physiologic distention of the urinary bladder. Mild
sympathetic thickening along the anterior hand right lateral wall
likely secondary to adjacent inflammatory disease from appendicitis.

Stomach/Bowel: Extensive right lower quadrant mesenteric fatty
inflammatory change with trace right lower quadrant and hemi pelvic
free fluid is identified without enhancement. This is secondary to
acute appendicitis.

Appendix:

Location: Right lower quadrant at McBurney's point

Diameter: 13 mm

Appendicolith: Present along its proximal to midportion measuring up
to 17 mm in length by 9 mm in thickness.

Mucosal hyper-enhancement: Present

Extraluminal gas: None

Periappendiceal collection: None

There is sigmoid diverticulosis without acute diverticulitis.

Vascular/Lymphatic: Moderate aortoiliac atherosclerosis. No
lymphadenopathy by CT size criteria.

Reproductive: Normal size prostate with central zone calcifications.

Other: Omental fat and fluid containing right inguinal canal.

Musculoskeletal: Lumbar spondylosis with marked degenerative disc
disease and vacuum disc phenomenon. No acute nor suspicious osseous
abnormality.
IMPRESSION: 1. Moderate-to-marked periappendiceal mesenteric inflammation
secondary to acute appendicitis. The appendix measures up to 13 mm
and contains hyperdense material within consistent and appendicular
as above described. No perforation or definite enhancing fluid to
suggest abscess.
2. Mild renal cortical thinning.  No obstructive uropathy.
3. Chronic mild distal esophageal thickening question esophagitis.
4. Lumbar spondylosis without acute osseous abnormality.
5. Faint hyperdensities along the dependent aspect of the
gallbladder are suggested. Findings are suspicious for small
gallstones and biliary sludge.
6. Omental fat and fluid containing right inguinal hernia.
